# Patient Record
Sex: Male | Born: 1995 | Race: White | Hispanic: No | Marital: Single | Smoking: Current some day smoker
Health system: Southern US, Community
[De-identification: ages and names within clinical notes are randomized; demographics above are authoritative.]

## PROBLEM LIST (undated history)

## (undated) ENCOUNTER — Emergency Department (HOSPITAL_COMMUNITY): Discharge status: Absent without leave | Payer: 59

## (undated) DIAGNOSIS — R480 Dyslexia and alexia: Secondary | ICD-10-CM

## (undated) DIAGNOSIS — F909 Attention-deficit hyperactivity disorder, unspecified type: Secondary | ICD-10-CM

## (undated) HISTORY — PX: NO PAST SURGERIES: SHX2092

---
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
## 2014-12-07 ENCOUNTER — Encounter (HOSPITAL_COMMUNITY): Payer: Self-pay | Admitting: *Deleted

## 2014-12-07 ENCOUNTER — Emergency Department (HOSPITAL_COMMUNITY)
Admission: EM | Admit: 2014-12-07 | Discharge: 2014-12-07 | Disposition: A | Discharge status: Home / Self Care | Payer: 59 | Source: Home / Self Care | Attending: Family Medicine | Admitting: Family Medicine

## 2014-12-07 DIAGNOSIS — R040 Epistaxis: Secondary | ICD-10-CM | POA: Diagnosis not present

## 2014-12-07 MED ORDER — OXYMETAZOLINE HCL 0.05 % NA SOLN
NASAL | Status: AC
  Filled 2014-12-07: qty 15

## 2014-12-07 NOTE — Discharge Instructions (Signed)
Use bacitracin ointment twice a day for 2-3 days , use pressure and nose spray if bleeding, see ent doctor if further problems.

## 2014-12-07 NOTE — ED Provider Notes (Signed)
CSN: [id number]     Arrival date & time 12/07/14  1301 History   None    Chief Complaint  Patient presents with  . Epistaxis   (Consider location/radiation/quality/duration/timing/severity/associated sxs/prior Treatment) Patient is a 19 y.o. male presenting with nosebleeds. The history is provided by the patient.  Epistaxis Location:  L nare Severity:  Mild Timing:  Intermittent Progression:  Improving (sx of bleeding on fri s/s, none on mon or tues, recurred this am briefly, none at  present, h/o old nasal trauma.) Chronicity:  Recurrent Context: trauma   Relieved by:  Applying pressure Worsened by:  Nothing tried Associated symptoms: no blood in oropharynx, no congestion, no fever and no sneezing     History reviewed. No pertinent past medical history. History reviewed. No pertinent past surgical history. History reviewed. No pertinent family history. Social History  Substance Use Topics  . Smoking status: Never Smoker   . Smokeless tobacco: None  . Alcohol Use: No    Review of Systems  Constitutional: Negative.  Negative for fever.  HENT: Positive for nosebleeds. Negative for congestion and sneezing.     Allergies  Review of patient's allergies indicates not on file.  Home Medications   Prior to Admission medications   Not on File   BP 121/82 mmHg  Pulse 88  Temp(Src) 98.2 F (36.8 C) (Oral)  Resp 16  SpO2 98% Physical Exam  Constitutional: He is oriented to person, place, and time. He appears well-developed and well-nourished. No distress.  HENT:  Head: Normocephalic.  Right Ear: External ear normal.  Left Ear: External ear normal.  Nose: No sinus tenderness. No epistaxis.    Mouth/Throat: Oropharynx is clear and moist.  Eyes: Conjunctivae are normal. Pupils are equal, round, and reactive to light.  Neck: Normal range of motion. Neck supple.  Neurological: He is alert and oriented to person, place, and time.  Skin: Skin is warm and dry.  Nursing  note and vitals reviewed.   ED Course  Procedures (including critical care time) Labs Review Labs Reviewed - No data to display  Imaging Review No results found.   MDM   1. Left-sided epistaxis    Afrin and bacitracin applied, no bleeding at time of d/c.    Linna Hoff, MD 12/07/14 2019

## 2014-12-07 NOTE — ED Notes (Signed)
Pt reports   intermittant      Nosebleed     X  4  Days   Ago     reoccured  Today     -     Sitting  Upright on  Exam table  Speaking in  Complete  sentances  And  Is  In no  Acute   Distress

## 2016-04-07 ENCOUNTER — Emergency Department (HOSPITAL_COMMUNITY)
Admission: EM | Admit: 2016-04-07 | Discharge: 2016-04-08 | Disposition: A | Discharge status: Home / Self Care | Payer: Worker's Compensation | Attending: Emergency Medicine | Admitting: Emergency Medicine

## 2016-04-07 ENCOUNTER — Encounter (HOSPITAL_COMMUNITY): Payer: Self-pay | Admitting: *Deleted

## 2016-04-07 DIAGNOSIS — F17200 Nicotine dependence, unspecified, uncomplicated: Secondary | ICD-10-CM | POA: Diagnosis not present

## 2016-04-07 DIAGNOSIS — F909 Attention-deficit hyperactivity disorder, unspecified type: Secondary | ICD-10-CM | POA: Diagnosis not present

## 2016-04-07 DIAGNOSIS — Y929 Unspecified place or not applicable: Secondary | ICD-10-CM | POA: Diagnosis not present

## 2016-04-07 DIAGNOSIS — S299XXA Unspecified injury of thorax, initial encounter: Secondary | ICD-10-CM | POA: Diagnosis present

## 2016-04-07 DIAGNOSIS — S29012A Strain of muscle and tendon of back wall of thorax, initial encounter: Secondary | ICD-10-CM | POA: Insufficient documentation

## 2016-04-07 DIAGNOSIS — Y9389 Activity, other specified: Secondary | ICD-10-CM | POA: Diagnosis not present

## 2016-04-07 DIAGNOSIS — Y990 Civilian activity done for income or pay: Secondary | ICD-10-CM | POA: Diagnosis not present

## 2016-04-07 DIAGNOSIS — X501XXA Overexertion from prolonged static or awkward postures, initial encounter: Secondary | ICD-10-CM | POA: Insufficient documentation

## 2016-04-07 DIAGNOSIS — T148XXA Other injury of unspecified body region, initial encounter: Secondary | ICD-10-CM

## 2016-04-07 HISTORY — DX: Attention-deficit hyperactivity disorder, unspecified type: F90.9

## 2016-04-07 NOTE — ED Notes (Signed)
[facility] He states he picked up a trashcan lid and had a sudden sharp pain to his midback- He denies numbness tingling, incontinence and reports his pain has eased during the trip in by EMS- He moves all extremities fluidly and states his pain is more when he stands- He also states he feels like he should pop his back but cannot

## 2016-04-07 NOTE — ED Triage Notes (Signed)
Pt arrived by EMS complaining of back pain. Was at work bent over & felt pain in his back. Pt rating pain a 3/10 w/ pain in the mid back.

## 2016-04-08 ENCOUNTER — Emergency Department (HOSPITAL_COMMUNITY): Payer: Worker's Compensation

## 2016-04-08 MED ORDER — IBUPROFEN 100 MG/5ML PO SUSP
400.0000 mg | Freq: Once | ORAL | Status: AC
  Administered 2016-04-08: 400 mg via ORAL
  Filled 2016-04-08: qty 20

## 2016-04-08 NOTE — ED Provider Notes (Signed)
[facility]-EMERGENCY DEPT Provider Note   CSN: [id number] Arrival date & time: 04/07/16  2333     History   Chief Complaint Chief Complaint  Patient presents with  . Back Pain    HPI Maurice Smith is a 20 y.o. male.  The history is provided by the patient. No language interpreter was used.  Back Pain   This is a new problem. The problem occurs constantly. The problem has been gradually worsening. The pain is associated with no known injury. The pain is present in the thoracic spine. The quality of the pain is described as shooting and aching. The pain is moderate. The symptoms are aggravated by bending. The pain is the same all the time. Pertinent negatives include no chest pain. He has tried nothing for the symptoms.  Pt reports he bent over to pick up a trash can lid and had severe midback pain    Past Medical History:  Diagnosis Date  . ADHD     There are no active problems to display for this patient.   History reviewed. No pertinent surgical history.     Home Medications    Prior to Admission medications   Not on File    Family History No family history on file.  Social History Social History  Substance Use Topics  . Smoking status: Current Some Day Smoker  . Smokeless tobacco: Never Used  . Alcohol use No     Allergies   Keflex [cephalexin]   Review of Systems Review of Systems  Cardiovascular: Negative for chest pain.  Musculoskeletal: Positive for back pain.  All other systems reviewed and are negative.    Physical Exam Updated Vital Signs BP 124/87 (BP Location: Left Arm)   Pulse 80   Temp 97.5 F (36.4 C) (Oral)   Resp 18   Ht 5\' 6"  (1.676 m)   Wt 49 kg   SpO2 97%   BMI 17.43 kg/m   Physical Exam  Constitutional: He appears well-developed and well-nourished.  HENT:  Head: Normocephalic and atraumatic.  Right Ear: External ear normal.  Left Ear: External ear normal.  Eyes: Conjunctivae are normal.  Neck: Neck supple.    Cardiovascular: Normal rate and regular rhythm.   No murmur heard. Pulmonary/Chest: Effort normal and breath sounds normal. No respiratory distress.  Abdominal: Soft. There is no tenderness.  Musculoskeletal: He exhibits no edema.  Tender thoracic spine mid back,  Pain with movement   Neurological: He is alert.  Skin: Skin is warm and dry.  Psychiatric: He has a normal mood and affect.  Nursing note and vitals reviewed.    ED Treatments / Results  Labs (all labs ordered are listed, but only abnormal results are displayed) Labs Reviewed - No data to display  EKG  EKG Interpretation None       Radiology Dg Chest 2 View  Result Date: 04/08/2016 CLINICAL DATA:  20 year old male with chest and mid back pain. EXAM: CHEST  2 VIEW COMPARISON:  None. FINDINGS: The heart size and mediastinal contours are within normal limits. Both lungs are clear. The visualized skeletal structures are unremarkable. IMPRESSION: No active cardiopulmonary disease. Electronically Signed   By: Elgie CollardArash  Radparvar M.D.   On: 04/08/2016 01:25    Procedures Procedures (including critical care time)  Medications Ordered in ED Medications - No data to display   Initial Impression / Assessment and Plan / ED Course  I have reviewed the triage vital signs and the nursing notes.  Pertinent labs &  imaging results that were available during my care of the patient were reviewed by me and considered in my medical decision making (see chart for details).  Clinical Course     Pt advised ibuprofen.  He can not take pills.   Pt advised to take childrens ibuprofen  Final Clinical Impressions(s) / ED Diagnoses   Final diagnoses:  Muscle strain    New Prescriptions New Prescriptions   No medications on file  An After Visit Summary was printed and given to the patient.   Lonia SkinnerLeslie K GrahamSofia, PA-C 04/08/16 0131    Devoria AlbeIva Knapp, MD 04/08/16 [phone]

## 2016-04-08 NOTE — ED Notes (Signed)
Awaiting rad read and dispo 

## 2017-07-01 IMAGING — DX DG CHEST 2V
2 series · 2 of 2 positions shown · non-contrast
Comparison: None.

CLINICAL DATA: 20-year-old male with chest and mid back pain.

EXAM:
CHEST  2 VIEW

[chest pa]
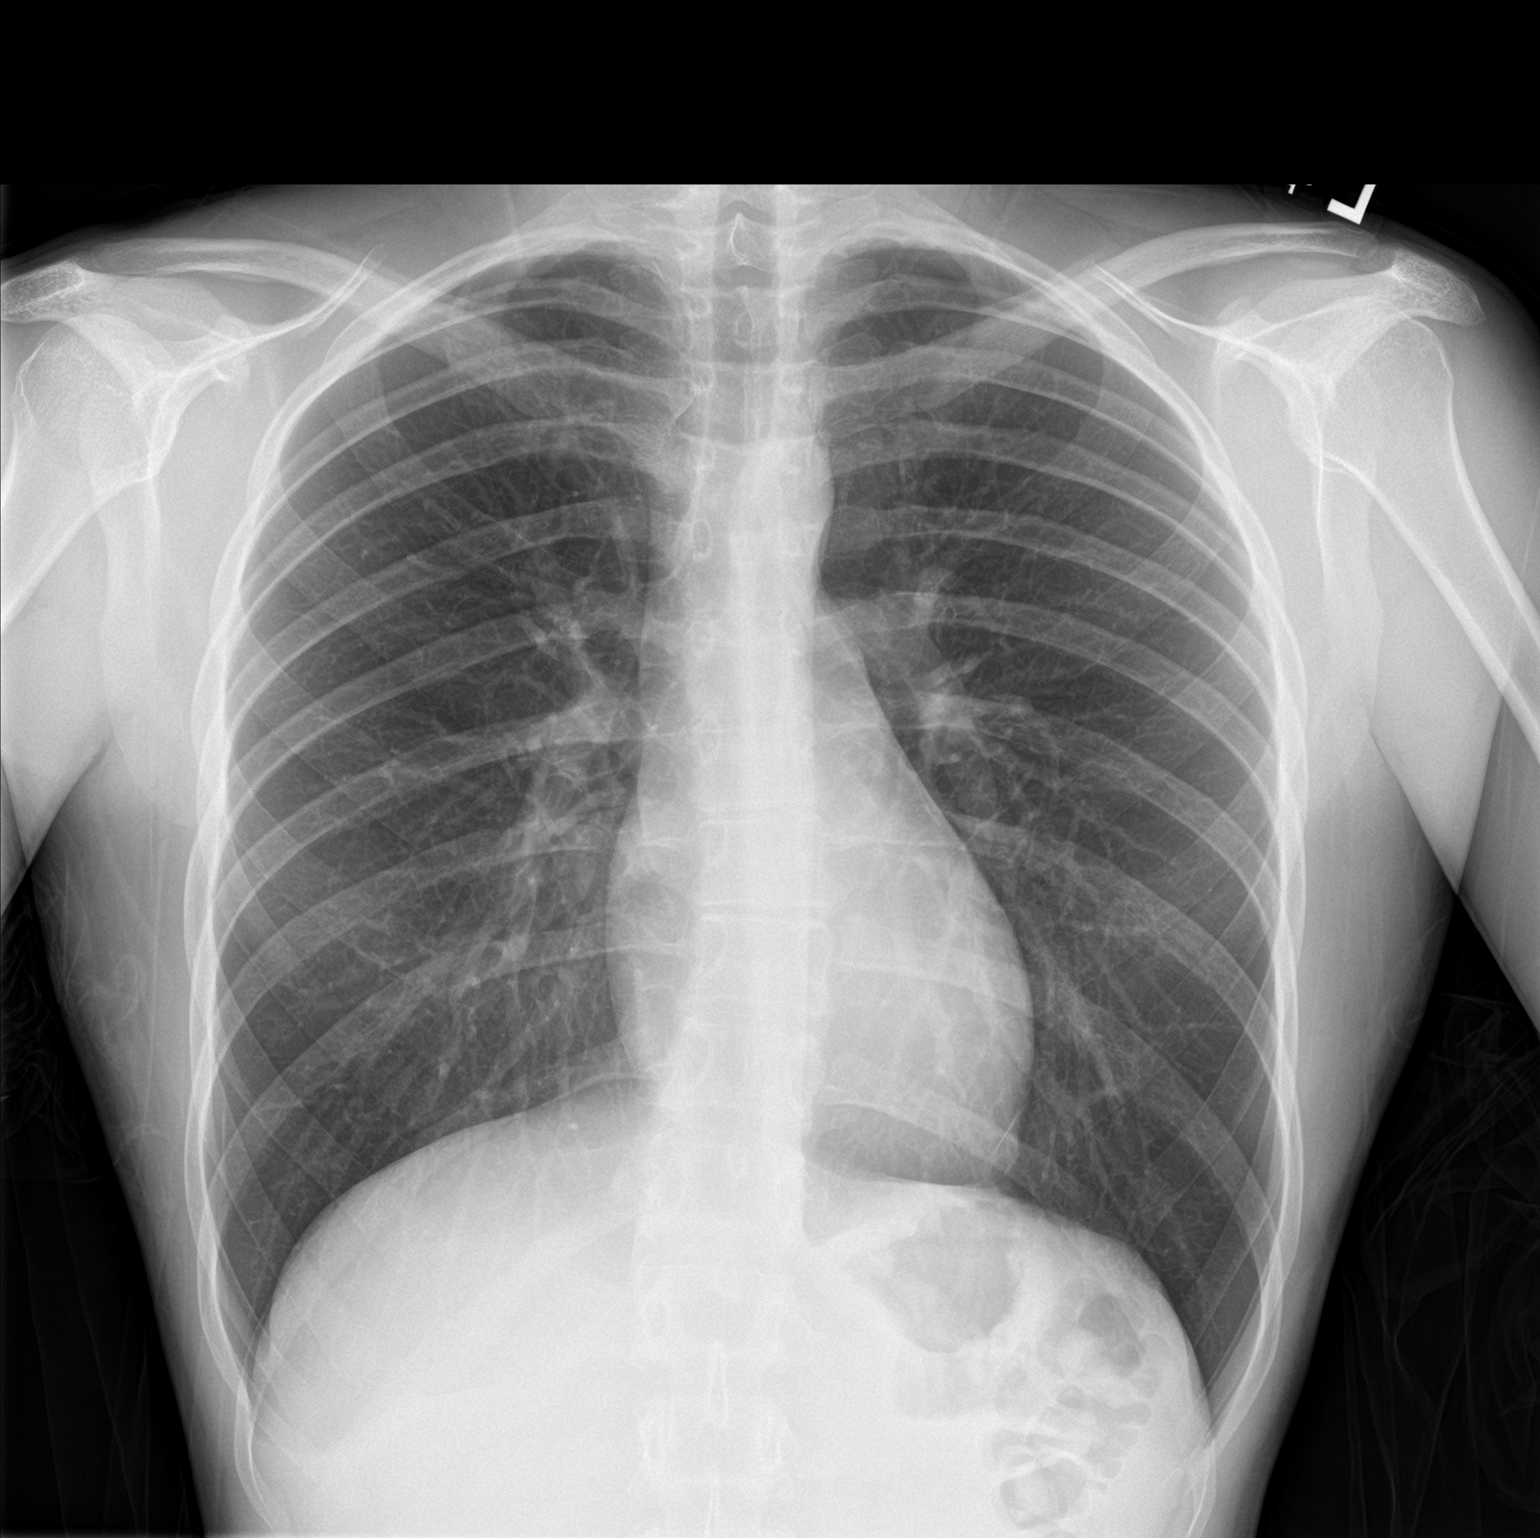

[chest lat]
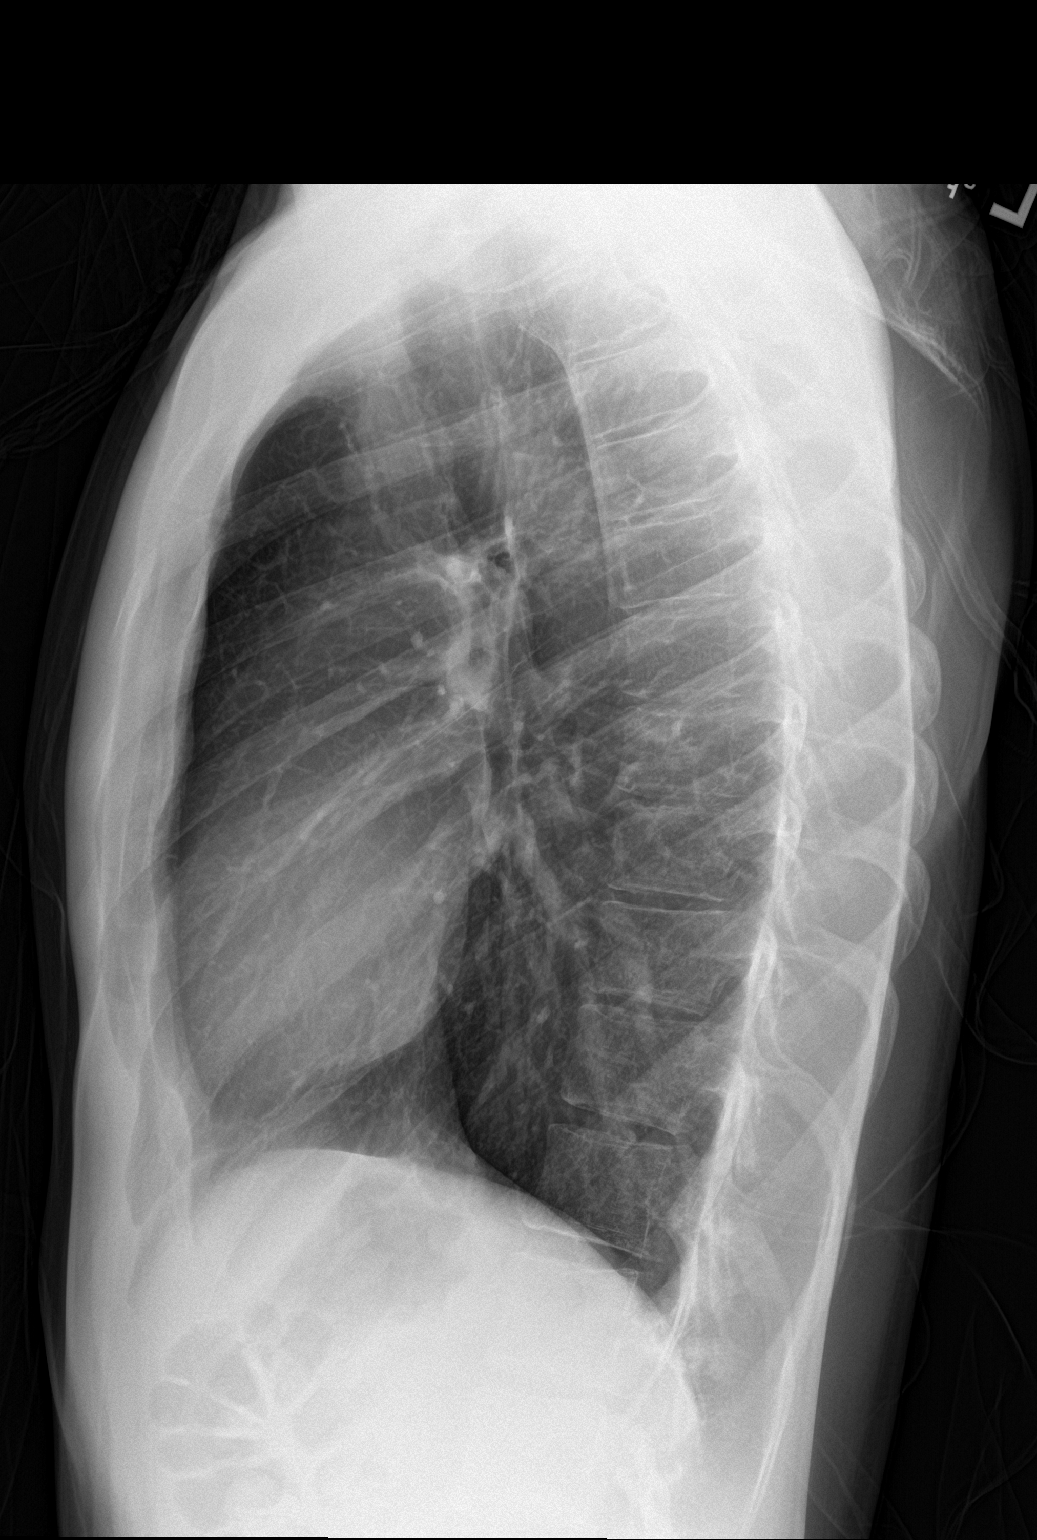

[2 of 2 positions shown; findings below may reference images not displayed]

FINDINGS: The heart size and mediastinal contours are within normal limits.
Both lungs are clear. The visualized skeletal structures are
unremarkable.
IMPRESSION: No active cardiopulmonary disease.

## 2017-07-26 ENCOUNTER — Encounter (HOSPITAL_COMMUNITY): Payer: Self-pay | Admitting: Emergency Medicine

## 2017-07-26 ENCOUNTER — Emergency Department (HOSPITAL_COMMUNITY)
Admission: EM | Admit: 2017-07-26 | Discharge: 2017-07-26 | Disposition: A | Discharge status: Home / Self Care | Payer: Commercial Managed Care - PPO | Attending: Emergency Medicine | Admitting: Emergency Medicine

## 2017-07-26 DIAGNOSIS — R1084 Generalized abdominal pain: Secondary | ICD-10-CM | POA: Insufficient documentation

## 2017-07-26 DIAGNOSIS — R197 Diarrhea, unspecified: Secondary | ICD-10-CM | POA: Insufficient documentation

## 2017-07-26 DIAGNOSIS — F909 Attention-deficit hyperactivity disorder, unspecified type: Secondary | ICD-10-CM | POA: Insufficient documentation

## 2017-07-26 DIAGNOSIS — F17210 Nicotine dependence, cigarettes, uncomplicated: Secondary | ICD-10-CM | POA: Insufficient documentation

## 2017-07-26 DIAGNOSIS — R112 Nausea with vomiting, unspecified: Secondary | ICD-10-CM

## 2017-07-26 LAB — CBC
HCT: 45.6 % (ref 39.0–52.0)
HEMOGLOBIN: 15.4 g/dL (ref 13.0–17.0)
MCH: 28.8 pg (ref 26.0–34.0)
MCHC: 33.8 g/dL (ref 30.0–36.0)
MCV: 85.4 fL (ref 78.0–100.0)
Platelets: 237 10*3/uL (ref 150–400)
RBC: 5.34 MIL/uL (ref 4.22–5.81)
RDW: 12.8 % (ref 11.5–15.5)
WBC: 12.1 10*3/uL — ABNORMAL HIGH (ref 4.0–10.5)

## 2017-07-26 LAB — COMPREHENSIVE METABOLIC PANEL
ALBUMIN: 4.9 g/dL (ref 3.5–5.0)
ALT: 44 U/L (ref 17–63)
ANION GAP: 14 (ref 5–15)
AST: 33 U/L (ref 15–41)
Alkaline Phosphatase: 98 U/L (ref 38–126)
BUN: 13 mg/dL (ref 6–20)
CHLORIDE: 104 mmol/L (ref 101–111)
CO2: 22 mmol/L (ref 22–32)
Calcium: 9.7 mg/dL (ref 8.9–10.3)
Creatinine, Ser: 0.83 mg/dL (ref 0.61–1.24)
GFR calc non Af Amer: >60 mL/min (ref >60)
GLUCOSE: 135 mg/dL — AB (ref 65–99)
Potassium: 3.8 mmol/L (ref 3.5–5.1)
SODIUM: 140 mmol/L (ref 135–145)
Total Bilirubin: 1 mg/dL (ref 0.3–1.2)
Total Protein: 8.2 g/dL — ABNORMAL HIGH (ref 6.5–8.1)

## 2017-07-26 LAB — URINALYSIS, ROUTINE W REFLEX MICROSCOPIC
BILIRUBIN URINE: NEGATIVE
GLUCOSE, UA: NEGATIVE mg/dL
Hgb urine dipstick: NEGATIVE
Ketones, ur: 5 mg/dL — AB
Leukocytes, UA: NEGATIVE
Nitrite: NEGATIVE
PH: 7 (ref 5.0–8.0)
Protein, ur: NEGATIVE mg/dL
SPECIFIC GRAVITY, URINE: 1.028 (ref 1.005–1.030)

## 2017-07-26 LAB — LIPASE, BLOOD: LIPASE: 32 U/L (ref 11–51)

## 2017-07-26 MED ORDER — ONDANSETRON HCL 4 MG/2ML IJ SOLN
INTRAMUSCULAR | Status: AC
  Filled 2017-07-26: qty 2

## 2017-07-26 MED ORDER — ONDANSETRON HCL 4 MG/2ML IJ SOLN
4.0000 mg | Freq: Once | INTRAMUSCULAR | Status: AC
  Administered 2017-07-26: 4 mg via INTRAVENOUS
  Filled 2017-07-26: qty 2

## 2017-07-26 MED ORDER — SODIUM CHLORIDE 0.9 % IV BOLUS
1000.0000 mL | Freq: Once | INTRAVENOUS | Status: AC
  Administered 2017-07-26: 1000 mL via INTRAVENOUS

## 2017-07-26 MED ORDER — ONDANSETRON HCL 4 MG/2ML IJ SOLN
4.0000 mg | Freq: Once | INTRAMUSCULAR | Status: AC
  Administered 2017-07-26: 4 mg via INTRAVENOUS

## 2017-07-26 MED ORDER — ONDANSETRON HCL 8 MG PO TABS: 8.0000 mg | ORAL_TABLET | Freq: Three times a day (TID) | ORAL | 0 refills | Status: DC | PRN

## 2017-07-26 MED ORDER — KETOROLAC TROMETHAMINE 30 MG/ML IJ SOLN
30.0000 mg | Freq: Once | INTRAMUSCULAR | Status: AC
  Administered 2017-07-26: 30 mg via INTRAVENOUS
  Filled 2017-07-26: qty 1

## 2017-07-26 MED ORDER — ONDANSETRON 4 MG PO TBDP
4.0000 mg | ORAL_TABLET | Freq: Once | ORAL | Status: AC | PRN
  Administered 2017-07-26: 4 mg via ORAL
  Filled 2017-07-26: qty 1

## 2017-07-26 MED ORDER — ONDANSETRON 8 MG PO TBDP: 8.0000 mg | ORAL_TABLET | Freq: Three times a day (TID) | ORAL | 0 refills | Status: DC | PRN

## 2017-07-26 NOTE — ED Notes (Signed)
Pt given ginger ale around 30 minutes ago. Pt is tolerating P/O challenge and has no further complaints of nausea or vomiting

## 2017-07-26 NOTE — Discharge Instructions (Addendum)
Start with a clear liquid diet today, using 1-2 ounces every hour then gradually advance to a regular diet.  For fever or pain, use Tylenol.  Follow-up with a doctor if you are not better in 4-5 days.  Return here, if needed, for problems.

## 2017-07-26 NOTE — ED Provider Notes (Signed)
Medical decision making [facility] EMERGENCY DEPARTMENT Provider Note   CSN: [id number] Arrival date & time: 07/26/17  0705     History   Chief Complaint Chief Complaint  Patient presents with  . Abdominal Pain    HPI Maurice Smith is a 22 y.o. male.  Patient presents for evaluation of nausea, vomiting and diarrhea.  Symptoms started this morning.  He complains of upper abdominal pain.  No known sick contacts or abnormal food ingestions.  He works as a Nature conservation officer at night.  He denies other recent problems.  There has been no fever, chills, headache, cough, chest pain, weakness or dizziness.  There are no other known modifying factors.  HPI  Past Medical History:  Diagnosis Date  . ADHD     There are no active problems to display for this patient.   History reviewed. No pertinent surgical history.      Home Medications    Prior to Admission medications   Medication Sig Start Date End Date Taking? Authorizing Provider  ondansetron (ZOFRAN-ODT) 8 MG disintegrating tablet Take 1 tablet (8 mg total) by mouth every 8 (eight) hours as needed for nausea or vomiting. 07/26/17   Mancel Bale, MD    Family History History reviewed. No pertinent family history.  Social History Social History   Tobacco Use  . Smoking status: Current Some Day Smoker  . Smokeless tobacco: Never Used  Substance Use Topics  . Alcohol use: Yes    Comment: occasionally  . Drug use: Never     Allergies   Keflex [cephalexin]   Review of Systems Review of Systems  All other systems reviewed and are negative.    Physical Exam Updated Vital Signs BP 118/66   Pulse (!) 101   Temp 97.7 F (36.5 C) (Oral)   Resp 16   Ht 5\' 6"  (1.676 m)   Wt 54.4 kg (120 lb)   SpO2 98%   BMI 19.37 kg/m   Physical Exam  Constitutional: He is oriented to person, place, and time. He appears well-developed and well-nourished. He does not appear ill.  HENT:  Head: Normocephalic and atraumatic.  Right  Ear: External ear normal.  Left Ear: External ear normal.  Eyes: Pupils are equal, round, and reactive to light. Conjunctivae and EOM are normal.  Neck: Normal range of motion and phonation normal. Neck supple.  Cardiovascular: Normal rate, regular rhythm and normal heart sounds.  Pulmonary/Chest: Effort normal and breath sounds normal. He exhibits no bony tenderness.  Abdominal: Soft. He exhibits no distension and no ascites. Bowel sounds are decreased. There is tenderness (Mild) in the right upper quadrant and left upper quadrant. There is no rigidity and no guarding. Hernia confirmed negative in the ventral area.  Musculoskeletal: Normal range of motion.  Neurological: He is alert and oriented to person, place, and time. No cranial nerve deficit or sensory deficit. He exhibits normal muscle tone. Coordination normal.  Skin: Skin is warm, dry and intact.  Psychiatric: He has a normal mood and affect. His behavior is normal. Judgment and thought content normal.  Nursing note and vitals reviewed.    ED Treatments / Results  Labs (all labs ordered are listed, but only abnormal results are displayed) Labs Reviewed  COMPREHENSIVE METABOLIC PANEL - Abnormal; Notable for the following components:      Result Value   Glucose, Bld 135 (*)    Total Protein 8.2 (*)    All other components within normal limits  CBC - Abnormal; Notable  for the following components:   WBC 12.1 (*)    All other components within normal limits  URINALYSIS, ROUTINE W REFLEX MICROSCOPIC - Abnormal; Notable for the following components:   APPearance HAZY (*)    Ketones, ur 5 (*)    All other components within normal limits  LIPASE, BLOOD    EKG None  Radiology No results found.  Procedures Procedures (including critical care time)  Medications Ordered in ED Medications  ondansetron (ZOFRAN-ODT) disintegrating tablet 4 mg (4 mg Oral Given 07/26/17 0752)  sodium chloride 0.9 % bolus 1,000 mL (0 mLs  Intravenous Stopped 07/26/17 1140)  ondansetron (ZOFRAN) injection 4 mg (4 mg Intravenous Given 07/26/17 0859)  ketorolac (TORADOL) 30 MG/ML injection 30 mg (30 mg Intravenous Given 07/26/17 0859)  ondansetron (ZOFRAN) injection 4 mg (4 mg Intravenous Given 07/26/17 1228)     Initial Impression / Assessment and Plan / ED Course  I have reviewed the triage vital signs and the nursing notes.  Pertinent labs & imaging results that were available during my care of the patient were reviewed by me and considered in my medical decision making (see chart for details).      Patient Vitals for the past 24 hrs:  BP Temp Temp src Pulse Resp SpO2 Height Weight  07/26/17 1237 118/66 - - (!) 101 16 98 % - -  07/26/17 1100 111/63 - - 98 18 100 % - -  07/26/17 0714 126/77 97.7 F (36.5 C) Oral 96 16 100 % - -  07/26/17 0713 - - - - - - 5\' 6"  (1.676 m) 54.4 kg (120 lb)    11:56 AM Reevaluation with update and discussion. After initial assessment and treatment, an updated evaluation reveals he was able to tolerate some oral fluid but then vomited after about an hour.  At this time he feels fairly well.  Discussed findings and plan with patient and his father, all questions were answered. Mancel BaleElliott Audianna Landgren   Medical decision making-nonspecific vomiting and diarrhea.  Abdominal pain likely secondary to vomiting.  Doubt bacterial colitis, metabolic instability or impending vascular collapse.  No indication for treatment with antibiotics for diarrhea at this time.  Differential diagnosis includes food poisoning, gastroenteritis from viral source, and nonspecific enteritis.  Nursing Notes Reviewed/ Care Coordinated Applicable Imaging Reviewed Interpretation of Laboratory Data incorporated into ED treatment  The patient appears reasonably screened and/or stabilized for discharge and I doubt any other medical condition or other [facility] requiring further screening, evaluation, or treatment in the ED at this time prior to  discharge.  Plan: Home Medications-OTC analgesia as needed; Home Treatments-gradually advance diet PCP as needed; return here if the recommended treatment, does not improve the symptoms; Recommended follow up- PCP as needed   Final Clinical Impressions(s) / ED Diagnoses   Final diagnoses:  Nausea vomiting and diarrhea    ED Discharge Orders        Ordered    ondansetron (ZOFRAN) 8 MG tablet  Every 8 hours PRN,   Status:  Discontinued     07/26/17 1159    ondansetron (ZOFRAN-ODT) 8 MG disintegrating tablet  Every 8 hours PRN     07/26/17 1233       Mancel BaleWentz, Teja Costen, MD 07/26/17 [phone]

## 2017-07-26 NOTE — ED Triage Notes (Signed)
Patient complaining of abdominal pain with vomiting and diarrhea that started at 0200 this morning.

## 2017-07-26 NOTE — ED Notes (Signed)
Pt informed of need for urine sample. Pt states he will provide a sample as soon as he is able to do so.

## 2017-07-26 NOTE — ED Notes (Signed)
Pt drank a cup of gingerale without vomiting.  Resting with eyes shut at this time.

## 2019-04-27 ENCOUNTER — Other Ambulatory Visit: Payer: Self-pay

## 2019-04-27 ENCOUNTER — Ambulatory Visit: Payer: Commercial Managed Care - PPO | Attending: Internal Medicine

## 2019-04-27 DIAGNOSIS — Z20822 Contact with and (suspected) exposure to covid-19: Secondary | ICD-10-CM

## 2019-04-28 LAB — NOVEL CORONAVIRUS, NAA: SARS-CoV-2, NAA: NOT DETECTED

## 2022-12-11 ENCOUNTER — Ambulatory Visit (HOSPITAL_COMMUNITY)
Admission: EM | Admit: 2022-12-11 | Discharge: 2022-12-12 | Disposition: A | Discharge status: Other Acute Inpatient Hospital | Payer: BC Managed Care – PPO | Attending: Registered Nurse | Admitting: Registered Nurse

## 2022-12-11 ENCOUNTER — Encounter (HOSPITAL_COMMUNITY): Payer: Self-pay | Admitting: Registered Nurse

## 2022-12-11 ENCOUNTER — Other Ambulatory Visit: Payer: Self-pay

## 2022-12-11 DIAGNOSIS — Z658 Other specified problems related to psychosocial circumstances: Secondary | ICD-10-CM | POA: Diagnosis not present

## 2022-12-11 DIAGNOSIS — Z9152 Personal history of nonsuicidal self-harm: Secondary | ICD-10-CM | POA: Insufficient documentation

## 2022-12-11 DIAGNOSIS — F322 Major depressive disorder, single episode, severe without psychotic features: Secondary | ICD-10-CM | POA: Diagnosis not present

## 2022-12-11 DIAGNOSIS — R45851 Suicidal ideations: Secondary | ICD-10-CM

## 2022-12-11 LAB — CBC WITH DIFFERENTIAL/PLATELET
Abs Immature Granulocytes: 0.03 10*3/uL (ref 0.00–0.07)
Basophils Absolute: 0 10*3/uL (ref 0.0–0.1)
Basophils Relative: 0 %
Eosinophils Absolute: 0 10*3/uL (ref 0.0–0.5)
Eosinophils Relative: 0 %
HCT: 43.8 % (ref 39.0–52.0)
Hemoglobin: 15.6 g/dL (ref 13.0–17.0)
Immature Granulocytes: 0 %
Lymphocytes Relative: 15 %
Lymphs Abs: 1.5 10*3/uL (ref 0.7–4.0)
MCH: 29.9 pg (ref 26.0–34.0)
MCHC: 35.6 g/dL (ref 30.0–36.0)
MCV: 84.1 fL (ref 80.0–100.0)
Monocytes Absolute: 0.3 10*3/uL (ref 0.1–1.0)
Monocytes Relative: 4 %
Neutro Abs: 8 10*3/uL — ABNORMAL HIGH (ref 1.7–7.7)
Neutrophils Relative %: 81 %
Platelets: 319 10*3/uL (ref 150–400)
RBC: 5.21 MIL/uL (ref 4.22–5.81)
RDW: 12.9 % (ref 11.5–15.5)
WBC: 9.8 10*3/uL (ref 4.0–10.5)
nRBC: 0 % (ref 0.0–0.2)

## 2022-12-11 LAB — URINALYSIS, ROUTINE W REFLEX MICROSCOPIC
Bacteria, UA: NONE SEEN
Bilirubin Urine: NEGATIVE
Glucose, UA: NEGATIVE mg/dL
Hgb urine dipstick: NEGATIVE
Ketones, ur: 20 mg/dL — AB
Leukocytes,Ua: NEGATIVE
Nitrite: NEGATIVE
Protein, ur: NEGATIVE mg/dL
Specific Gravity, Urine: 1.017 (ref 1.005–1.030)
pH: 7 (ref 5.0–8.0)

## 2022-12-11 LAB — COMPREHENSIVE METABOLIC PANEL
ALT: 13 U/L (ref 0–44)
AST: 18 U/L (ref 15–41)
Albumin: 5 g/dL (ref 3.5–5.0)
Alkaline Phosphatase: 67 U/L (ref 38–126)
Anion gap: 10 (ref 5–15)
BUN: 7 mg/dL (ref 6–20)
CO2: 25 mmol/L (ref 22–32)
Calcium: 9.5 mg/dL (ref 8.9–10.3)
Chloride: 102 mmol/L (ref 98–111)
Creatinine, Ser: 0.99 mg/dL (ref 0.61–1.24)
GFR, Estimated: >60 mL/min (ref >60)
Glucose, Bld: 134 mg/dL — ABNORMAL HIGH (ref 70–99)
Potassium: 4.1 mmol/L (ref 3.5–5.1)
Sodium: 137 mmol/L (ref 135–145)
Total Bilirubin: 1 mg/dL (ref 0.3–1.2)
Total Protein: 8 g/dL (ref 6.5–8.1)

## 2022-12-11 LAB — ETHANOL: Alcohol, Ethyl (B): <10 mg/dL (ref <10)

## 2022-12-11 LAB — POCT URINE DRUG SCREEN - MANUAL ENTRY (I-SCREEN)
POC Amphetamine UR: NOT DETECTED
POC Buprenorphine (BUP): NOT DETECTED
POC Cocaine UR: NOT DETECTED
POC Marijuana UR: NOT DETECTED
POC Methadone UR: NOT DETECTED
POC Methamphetamine UR: NOT DETECTED
POC Morphine: NOT DETECTED
POC Oxazepam (BZO): NOT DETECTED
POC Oxycodone UR: NOT DETECTED
POC Secobarbital (BAR): NOT DETECTED

## 2022-12-11 LAB — TSH: TSH: 1.353 u[IU]/mL (ref 0.350–4.500)

## 2022-12-11 LAB — HEMOGLOBIN A1C
Hgb A1c MFr Bld: 5 % (ref 4.8–5.6)
Mean Plasma Glucose: 96.8 mg/dL

## 2022-12-11 LAB — LIPID PANEL
Cholesterol: 113 mg/dL (ref 0–200)
HDL: 49 mg/dL (ref >40)
LDL Cholesterol: 56 mg/dL (ref 0–99)
Total CHOL/HDL Ratio: 2.3 RATIO
Triglycerides: 39 mg/dL (ref <150)
VLDL: 8 mg/dL (ref 0–40)

## 2022-12-11 LAB — MAGNESIUM: Magnesium: 2.2 mg/dL (ref 1.7–2.4)

## 2022-12-11 MED ORDER — TRAZODONE HCL 50 MG PO TABS
50.0000 mg | ORAL_TABLET | Freq: Every evening | ORAL | Status: DC | PRN
  Filled 2022-12-11: qty 1

## 2022-12-11 MED ORDER — ALUM & MAG HYDROXIDE-SIMETH 200-200-20 MG/5ML PO SUSP: 30.0000 mL | ORAL | Status: DC | PRN

## 2022-12-11 MED ORDER — MAGNESIUM HYDROXIDE 400 MG/5ML PO SUSP: 30.0000 mL | Freq: Every day | ORAL | Status: DC | PRN

## 2022-12-11 MED ORDER — ACETAMINOPHEN 325 MG PO TABS: 650.0000 mg | ORAL_TABLET | Freq: Four times a day (QID) | ORAL | Status: DC | PRN

## 2022-12-11 MED ORDER — HYDROXYZINE HCL 25 MG PO TABS
25.0000 mg | ORAL_TABLET | Freq: Three times a day (TID) | ORAL | Status: DC | PRN
  Filled 2022-12-11: qty 1

## 2022-12-11 NOTE — ED Notes (Signed)
Pt sleeping@this time. Breathing even and unlabored. Will continue to monitor for safety 

## 2022-12-11 NOTE — ED Provider Notes (Cosign Needed Addendum)
[facility] Continuous Assessment Admission H&P  Date: 12/11/22 Patient Name: Maurice Smith MRN: [id number] Chief Complaint: depression, passive suicidal ideation  Diagnoses:  Final diagnoses:  MDD (major depressive disorder), single episode, severe , no psychosis (HCC)  Passive suicidal ideations    HPI: History of Present illness: Maurice Smith is a 27 y.o. male patient presented to [facility] as a walk in accompanied by his mother [name] )with complaints of worsening depression and passive suicidal ideation   Maurice Smith, 27 y.o., male patient seen face to face by this provider, chart reviewed, and consulted with Dr. Nelly Rout on 12/11/22.  On evaluation Maurice Smith reports he came in today because "I just get to the point to where I don't want to commit suicide because I know how it is on everybody but if I get in an accident I wouldn't care if I die. I couldn't do it to myself because I couldn't do that to my mom." Patient reports that they do have a history of self-harming behavior (cutting) but he hasn't cut since 2021 appear reported some behavior he started while he was married I used it as a means to punish himself. Patient reports he has been divorced for two years now. He states his wife get pregnant by another guy which led to the divorce. Patient stating periods of depression since did the divorce.  Patient states he recently broke up with girlfriend after she cheated.  States they work together, and she came to work with hickeys all over neck.  A guy "told me look at that (referring to the hickeys on her neck) come to find out he was the one that put the hickeys there."  Patient states he feels worthless "That nobody cares about me, that I can't be happy, and that I'm a disappointment."  Patient states he has been having suicidal thoughts of want to be dead but no specific plan, but he is unable to contract for safety.  He denies homicidal ideation, psychosis, and paranoia.   Patient does state that he watched his ex father in law try to kill himself twice by stabbing himself in the neck.  Denies prior psychiatric hospitalization, psychotropic medications, or outpatient psychiatric services.  Patient is currently living with his mother who is also supportive.  Patient is employed "All I do is work and sleep."     During evaluation Lemichael Smith is seated in exam room with mother at his side.  Patient gave permission for mother to sit in on assessment.  Patient is dressed appropriately for weather with no noted distress.  He is alert/oriented x 4, calm, cooperative, attentive, and responses were relevant and appropriate to assessment questions.  He spoke in a clear tone at moderate volume, and normal pace, with good eye contact.   He denies homicidal ideation, psychosis, and paranoia but continues to endorse suicidal ideation with no specific plan but unable to contract for safety.  Objectively there is no evidence of psychosis/mania or delusional thinking.  He conversed coherently, with goal directed thoughts, no distractibility, or pre-occupation.  Recommended for inpatient psychiatric treatment.     Total Time spent with patient: 45 minutes  Musculoskeletal  Strength & Muscle Tone: within normal limits Gait & Station: normal Patient leans: N/A  Psychiatric Specialty Exam  Presentation General Appearance:  Appropriate for Environment  Eye Contact: Minimal  Speech: Clear and Coherent; Normal Rate  Speech Volume: Normal  Handedness: Right   Mood and Affect  Mood: Depressed  Affect: Depressed; Flat   Thought Process  Thought Processes: Coherent; Goal Directed  Descriptions of Associations:Intact  Orientation:Full (Time, Place and Person)  Thought Content:Logical    Hallucinations:Hallucinations: None  Ideas of Reference:None  Suicidal Thoughts:Suicidal Thoughts: Yes, Passive SI Passive Intent and/or Plan: Without Plan (unable to contract  for safety)  Homicidal Thoughts:Homicidal Thoughts: No   Sensorium  Memory: Immediate Good; Recent Good; Remote Good  Judgment: Intact  Insight: Fair; Present   Executive Functions  Concentration: Good  Attention Span: Good  Recall: Good  Fund of Knowledge: Good  Language: Good   Psychomotor Activity  Psychomotor Activity: Psychomotor Activity: Normal   Assets  Assets: Communication Skills; Desire for Improvement; Financial Resources/Insurance; Housing; Leisure Time; Physical Health; Resilience; Social Support; Transportation   Sleep  Sleep: Sleep: Good   Nutritional Assessment (For OBS and FBC admissions only) Has the patient had a weight loss or gain of 10 pounds or more in the last 3 months?: No Has the patient had a decrease in food intake/or appetite?: No Does the patient have dental problems?: No Does the patient have eating habits or behaviors that may be indicators of an eating disorder including binging or inducing vomiting?: No Has the patient recently lost weight without trying?: 0 Has the patient been eating poorly because of a decreased appetite?: 0 Malnutrition Screening Tool Score: 0    Physical Exam Vitals and nursing note reviewed. Chaperone present: mother accompanied.  Constitutional:      General: He is not in acute distress.    Appearance: Normal appearance. He is not ill-appearing.  HENT:     Head: Normocephalic.  Eyes:     Conjunctiva/sclera: Conjunctivae normal.  Cardiovascular:     Rate and Rhythm: Normal rate.  Pulmonary:     Effort: Pulmonary effort is normal.  Musculoskeletal:        General: Normal range of motion.     Cervical back: Normal range of motion.  Skin:    General: Skin is warm and dry.  Neurological:     Mental Status: He is alert and oriented to person, place, and time.  Psychiatric:        Attention and Perception: Attention and perception normal. He does not perceive auditory or visual  hallucinations.        Mood and Affect: Mood is depressed. Affect is flat.        Speech: Speech normal.        Behavior: Behavior normal. Behavior is cooperative.        Thought Content: Thought content is not paranoid or delusional. Thought content does not include homicidal ideation. Suicidal: Passive thoughts unable to contract for safety.       Cognition and Memory: Cognition and memory normal.        Judgment: Judgment is impulsive.    Review of Systems  Constitutional:        No other complaints voiced  Psychiatric/Behavioral:  Positive for depression. Hallucinations: Denies. Substance abuse: denies. Suicidal ideas: Passive thoughts.  States that he wouldn't actively kill himself but doesn't care if he dies..The patient is nervous/anxious. The patient does not have insomnia.   All other systems reviewed and are negative.   Blood pressure 110/77, pulse 89, temperature 97.6 F (36.4 C), temperature source Oral, resp. rate 17, SpO2 98%. There is no height or weight on file to calculate BMI.  Past Psychiatric History: Denies prior psychiatric history other than ADHD   Is the patient at risk to self? Yes  Has the patient been a risk to self in the past 6 months? No .    Has the patient been a risk to self within the distant past? No   Is the patient a risk to others? No   Has the patient been a risk to others in the past 6 months? No   Has the patient been a risk to others within the distant past? No   Past Medical History: No medical issues reported Past Medical History:  Diagnosis Date   ADHD      Family History: History reviewed. No pertinent family history.   None reported  Social History: Lives with mother employed Social History   Tobacco Use   Smoking status: Some Days   Smokeless tobacco: Never  Substance Use Topics   Alcohol use: Yes    Comment: occasionally   Drug use: Never     Last Labs:  No visits with results within 6 Month(s) from this visit.  Latest  known visit with results is:  Lab on 04/27/2019  Component Date Value Ref Range Status   SARS-CoV-2, NAA 04/27/2019 Not Detected  Not Detected Final   Comment: This nucleic acid amplification test was developed and its performance characteristics determined by [employer]. Nucleic acid amplification tests include PCR and TMA. This test has not been FDA cleared or approved. This test has been authorized by FDA under an Emergency Use Authorization (EUA). This test is only authorized for the duration of time the declaration that circumstances exist justifying the authorization of the emergency use of in vitro diagnostic tests for detection of SARS-CoV-2 virus and/or diagnosis of COVID-19 infection under section 564(b)(1) of the Act, 21 U.S.C. 161WRU-0(A) (1), unless the authorization is terminated or revoked sooner. When diagnostic testing is negative, the possibility of a false negative result should be considered in the context of a patient's recent exposures and the presence of clinical signs and symptoms consistent with COVID-19. An individual without symptoms of COVID-19 and who is not shedding SARS-CoV-2 virus would                           expect to have a negative (not detected) result in this assay.     Allergies: Keflex [cephalexin]  Medications:  Facility Ordered Medications  Medication   acetaminophen (TYLENOL) tablet 650 mg   alum & mag hydroxide-simeth (MAALOX/MYLANTA) 200-200-20 MG/5ML suspension 30 mL   magnesium hydroxide (MILK OF MAGNESIA) suspension 30 mL   hydrOXYzine (ATARAX) tablet 25 mg   traZODone (DESYREL) tablet 50 mg   PTA Medications  Medication Sig   ondansetron (ZOFRAN-ODT) 8 MG disintegrating tablet Take 1 tablet (8 mg total) by mouth every 8 (eight) hours as needed for nausea or vomiting.   Medical Decision Making  Liam Arbon was admitted to [facility] continuous observation unit for MDD (major depressive  disorder), single episode, severe , no psychosis (HCC), crisis management, and stabilization while awaiting appropriate bed for inpatient psychiatric treatment Routine labs ordered, which include Lab Orders         CBC with Differential/Platelet         Comprehensive metabolic panel         Hemoglobin A1c         Magnesium         Ethanol         Lipid panel         TSH  Prolactin         Urinalysis, Routine w reflex microscopic -Urine, Clean Catch         POCT Urine Drug Screen - (I-Screen)    Medication Management: Medications started Meds ordered this encounter  Medications   acetaminophen (TYLENOL) tablet 650 mg   alum & mag hydroxide-simeth (MAALOX/MYLANTA) 200-200-20 MG/5ML suspension 30 mL   magnesium hydroxide (MILK OF MAGNESIA) suspension 30 mL   hydrOXYzine (ATARAX) tablet 25 mg   traZODone (DESYREL) tablet 50 mg   Will maintain continuous observation for safety. Social work will consult patient regarding psychiatric hospitalization and to discuss discharge and follow up plan.  Recommendations  Based on my evaluation the patient does not appear to have an emergency medical condition.  Inpatient psychiatric treatment  Assunta Found, NP 12/11/22  6:36 PM

## 2022-12-11 NOTE — ED Notes (Signed)
Pt A&O x 4, presents with depression and passive suicidal thoughts.  Family stressors noted, Pt calm & cooperative, no distress noted.  Monitoring for safety.

## 2022-12-11 NOTE — ED Provider Notes (Cosign Needed Addendum)
Behavioral Health Urgent Care Medical Screening Exam  Patient Name: Jessiel Langlais MRN: [id number] Date of Evaluation: 12/11/22 Chief Complaint:   Diagnosis:  Final diagnoses:  MDD (major depressive disorder), single episode, severe , no psychosis (HCC)  Passive suicidal ideations    History of Present illness: Stephen Ristine is a 27 y.o. male patient presented to [facility] as a walk in accompanied by his mother [name] )with complaints of worsening depression and passive suicidal ideation  Flossie Amato, 27 y.o., male patient seen face to face by this provider, chart reviewed, and consulted with Dr. Nelly Rout on 12/11/22.  On evaluation Shonta Meli reports he came in today because "I just get to the point to where I don't want to commit suicide because I know how it is on everybody but if I get in an accident I wouldn't care if I die. I couldn't do it to myself because I couldn't do that to my mom." Patient reports that they do have a history of self-harming behavior (cutting) but he hasn't cut since 2021 appear reported some behavior he started while he was married I used it as a means to punish himself. Patient reports he has been divorced for two years now. He states his wife get pregnant by another guy which led to the divorce. Patient stating periods of depression since did the divorce.  Patient states he recently broke up with girlfriend after she cheated.  States they work together, and she came to work with hickeys all over neck.  A guy "told me look at that (referring to the hickeys on her neck) come to find out he was the one that put the hickeys there."  Patient states he feels worthless "That nobody cares about me, that I can't be happy, and that I'm a disappointment."  Patient states he has been having suicidal thoughts of want to be dead but no specific plan, but he is unable to contract for safety.  He denies homicidal ideation, psychosis, and paranoia.  Patient does state that he  watched his ex father in law try to kill himself twice by stabbing himself in the neck.  Denies prior psychiatric hospitalization, psychotropic medications, or outpatient psychiatric services.  Patient is currently living with his mother who is also supportive.  Patient is employed "All I do is work and sleep."     During evaluation Symir Flitter is seated in exam room with mother at his side.  Patient gave permission for mother to sit in on assessment.  Patient is dressed appropriately for weather with no noted distress.  He is alert/oriented x 4, calm, cooperative, attentive, and responses were relevant and appropriate to assessment questions.  He spoke in a clear tone at moderate volume, and normal pace, with good eye contact.   He denies homicidal ideation, psychosis, and paranoia but continues to endorse suicidal ideation with no specific plan but unable to contract for safety.  Objectively there is no evidence of psychosis/mania or delusional thinking.  He conversed coherently, with goal directed thoughts, no distractibility, or pre-occupation.  Recommended for inpatient psychiatric treatment.    Flowsheet Row ED from 12/11/2022 in [facility]  C-SSRS RISK CATEGORY Low Risk       Psychiatric Specialty Exam  Presentation  General Appearance:Appropriate for Environment  Eye Contact:Minimal  Speech:Clear and Coherent; Normal Rate  Speech Volume:Normal  Handedness:Right   Mood and Affect  Mood: Depressed  Affect: Depressed; Flat   Thought Process  Thought Processes:  Coherent; Goal Directed  Descriptions of Associations:Intact  Orientation:Full (Time, Place and Person)  Thought Content:Logical    Hallucinations:None  Ideas of Reference:None  Suicidal Thoughts:Yes, Passive Without Plan (unable to contract for safety)  Homicidal Thoughts:No   Sensorium  Memory: Immediate Good; Recent Good; Remote  Good  Judgment: Intact  Insight: Fair; Present   Executive Functions  Concentration: Good  Attention Span: Good  Recall: Good  Fund of Knowledge: Good  Language: Good   Psychomotor Activity  Psychomotor Activity: Normal   Assets  Assets: Communication Skills; Desire for Improvement; Financial Resources/Insurance; Housing; Leisure Time; Physical Health; Resilience; Social Support; Transportation   Sleep  Sleep: Good  Number of hours: No data recorded  Physical Exam: Physical Exam Vitals and nursing note reviewed. Chaperone present: mother accompanied.  Constitutional:      General: He is not in acute distress.    Appearance: Normal appearance. He is not ill-appearing.  HENT:     Head: Normocephalic.  Eyes:     Conjunctiva/sclera: Conjunctivae normal.  Cardiovascular:     Rate and Rhythm: Normal rate.  Pulmonary:     Effort: Pulmonary effort is normal.  Musculoskeletal:        General: Normal range of motion.     Cervical back: Normal range of motion.  Skin:    General: Skin is warm and dry.  Neurological:     Mental Status: He is alert and oriented to person, place, and time.  Psychiatric:        Attention and Perception: Attention and perception normal. He does not perceive auditory or visual hallucinations.        Mood and Affect: Mood is depressed. Affect is flat.        Speech: Speech normal.        Behavior: Behavior normal. Behavior is cooperative.        Thought Content: Thought content is not paranoid or delusional. Thought content does not include homicidal ideation. Suicidal: Passive thoughts unable to contract for safety.       Cognition and Memory: Cognition and memory normal.        Judgment: Judgment is impulsive.    Review of Systems  Constitutional:        No other complaints voiced  Psychiatric/Behavioral:  Positive for depression. Hallucinations: Denies. Substance abuse: denies. Suicidal ideas: Passive thoughts.  States that  he wouldn't actively kill himself but doesn't care if he dies..The patient is nervous/anxious. The patient does not have insomnia.   All other systems reviewed and are negative.  Blood pressure 110/77, pulse 89, temperature 97.6 F (36.4 C), temperature source Oral, resp. rate 17, SpO2 98%. There is no height or weight on file to calculate BMI.  Musculoskeletal: Strength & Muscle Tone: within normal limits Gait & Station: normal Patient leans: N/A   [facility] MSE Discharge Disposition for Follow up and Recommendations: Based on my evaluation the patient does not appear to have an emergency medical condition and can be discharged with resources and follow up care in outpatient services for Inpatient psychiatric treatment   Marveline Profeta, NP 12/11/2022, 6:37 PM

## 2022-12-11 NOTE — Progress Notes (Addendum)
   12/11/22 1703  [facility] Triage Screening (Walk-ins at [facility] only)  What Is the Reason for Your Visit/Call Today? Pt arrived to [facility] voluntarily accompanied by his mother. Pt states that he is tired of living but he doesn't want to kill himself. Pt states that he would be fine if he was to get in an accident and not make it. Per pt's mother there is a family history of depression. Pt states that he did cause self afflicted pain to himself in the past by cutting. Pt states that he just wants to sit in a dark corner and be alone. Pt denies SI and AVH at this current moment. Pt endorses HI at this time, but states that he doesn't want to kill him but he does want to beat the "shit" out of him. Pt states he used a tobacco vape pen before arriving to the facility. Pt states that he goes at least 2 days without eating but will try on the 3rd day.  How Long Has This Been Causing You Problems? 1 wk - 1 month  Have You Recently Had Any Thoughts About Hurting Yourself? No  Are You Planning to Commit Suicide/Harm Yourself At This time? No  Have you Recently Had Thoughts About Hurting Someone Karolee Ohs? Yes  How long ago did you have thoughts of harming others? a few weeks ago  Are You Planning To Harm Someone At This Time? No  Are you currently experiencing any auditory, visual or other hallucinations? No  Have You Used Any Alcohol or Drugs in the Past 24 Hours? Yes  How long ago did you use Drugs or Alcohol? before arriving  What Did You Use and How Much? vaping a tobacco pen  Do you have any current medical co-morbidities that require immediate attention? No  Clinician description of patient physical appearance/behavior: neatly dressed, poor eye contact, cooperative  What Do You Feel Would Help You the Most Today? Treatment for Depression or other mood problem;Social Support  If access to [facility] Urgent Care was not available, would you have sought care in the Emergency Department? No  Determination of Need Urgent (48  hours)  Options For Referral Intensive Outpatient Therapy;Medication Management

## 2022-12-11 NOTE — Progress Notes (Signed)
Pt meets inpatient BH placement per Shuvon Rankin,NP. Per [facility] Danika Riley,RN pt can be accepted within the [facility] system PENDING discharges TOMORROW 12/12/2022. 1st shift to follow up with [facility].    Care Team notified: Day [facility] Danika Riley,RN, Night [facility] Kim Brooks,RN, Bethany Hendra,LCSW, Shuvon Rankin,NP    Maryjean Ka, MSW, [facility] 12/11/2022 8:57 PM

## 2022-12-12 ENCOUNTER — Encounter (HOSPITAL_COMMUNITY): Payer: Self-pay | Admitting: Emergency Medicine

## 2022-12-12 ENCOUNTER — Inpatient Hospital Stay (HOSPITAL_COMMUNITY)
Admission: AD | Admit: 2022-12-12 | Discharge: 2022-12-17 | DRG: 885 | Disposition: A | Discharge status: Home / Self Care | Payer: BC Managed Care – PPO | Source: Intra-hospital | Attending: Psychiatry | Admitting: Psychiatry

## 2022-12-12 DIAGNOSIS — R480 Dyslexia and alexia: Secondary | ICD-10-CM | POA: Diagnosis present

## 2022-12-12 DIAGNOSIS — Z9152 Personal history of nonsuicidal self-harm: Secondary | ICD-10-CM | POA: Diagnosis not present

## 2022-12-12 DIAGNOSIS — Z7289 Other problems related to lifestyle: Secondary | ICD-10-CM | POA: Diagnosis not present

## 2022-12-12 DIAGNOSIS — Z9151 Personal history of suicidal behavior: Secondary | ICD-10-CM

## 2022-12-12 DIAGNOSIS — F329 Major depressive disorder, single episode, unspecified: Principal | ICD-10-CM | POA: Diagnosis present

## 2022-12-12 DIAGNOSIS — Z630 Problems in relationship with spouse or partner: Secondary | ICD-10-CM | POA: Diagnosis not present

## 2022-12-12 DIAGNOSIS — Z888 Allergy status to other drugs, medicaments and biological substances status: Secondary | ICD-10-CM

## 2022-12-12 DIAGNOSIS — Z818 Family history of other mental and behavioral disorders: Secondary | ICD-10-CM | POA: Diagnosis not present

## 2022-12-12 DIAGNOSIS — R45851 Suicidal ideations: Secondary | ICD-10-CM | POA: Diagnosis not present

## 2022-12-12 DIAGNOSIS — F322 Major depressive disorder, single episode, severe without psychotic features: Secondary | ICD-10-CM | POA: Diagnosis present

## 2022-12-12 DIAGNOSIS — F411 Generalized anxiety disorder: Secondary | ICD-10-CM | POA: Diagnosis present

## 2022-12-12 DIAGNOSIS — F909 Attention-deficit hyperactivity disorder, unspecified type: Secondary | ICD-10-CM | POA: Diagnosis present

## 2022-12-12 DIAGNOSIS — F321 Major depressive disorder, single episode, moderate: Secondary | ICD-10-CM | POA: Diagnosis not present

## 2022-12-12 DIAGNOSIS — Z79899 Other long term (current) drug therapy: Secondary | ICD-10-CM

## 2022-12-12 HISTORY — DX: Dyslexia and alexia: R48.0

## 2022-12-12 MED ORDER — MAGNESIUM HYDROXIDE 400 MG/5ML PO SUSP: 30.0000 mL | Freq: Every day | ORAL | Status: DC | PRN

## 2022-12-12 MED ORDER — HALOPERIDOL 5 MG PO TABS: 5.0000 mg | ORAL_TABLET | Freq: Three times a day (TID) | ORAL | Status: DC | PRN

## 2022-12-12 MED ORDER — ALUM & MAG HYDROXIDE-SIMETH 200-200-20 MG/5ML PO SUSP: 30.0000 mL | ORAL | Status: DC | PRN

## 2022-12-12 MED ORDER — DIPHENHYDRAMINE HCL 25 MG PO CAPS: 50.0000 mg | ORAL_CAPSULE | Freq: Three times a day (TID) | ORAL | Status: DC | PRN

## 2022-12-12 MED ORDER — DIPHENHYDRAMINE HCL 50 MG/ML IJ SOLN: 50.0000 mg | Freq: Three times a day (TID) | INTRAMUSCULAR | Status: DC | PRN

## 2022-12-12 MED ORDER — HALOPERIDOL LACTATE 5 MG/ML IJ SOLN: 5.0000 mg | Freq: Three times a day (TID) | INTRAMUSCULAR | Status: DC | PRN

## 2022-12-12 MED ORDER — HYDROXYZINE HCL 25 MG PO TABS
25.0000 mg | ORAL_TABLET | Freq: Three times a day (TID) | ORAL | Status: DC | PRN
  Administered 2022-12-13 – 2022-12-15 (×3): 25 mg via ORAL
  Filled 2022-12-12 (×4): qty 1

## 2022-12-12 MED ORDER — ACETAMINOPHEN 325 MG PO TABS: 650.0000 mg | ORAL_TABLET | Freq: Four times a day (QID) | ORAL | Status: DC | PRN

## 2022-12-12 MED ORDER — LORAZEPAM 2 MG/ML IJ SOLN: 2.0000 mg | Freq: Three times a day (TID) | INTRAMUSCULAR | Status: DC | PRN

## 2022-12-12 MED ORDER — TRAZODONE HCL 50 MG PO TABS
50.0000 mg | ORAL_TABLET | Freq: Every evening | ORAL | Status: DC | PRN
  Filled 2022-12-12: qty 1

## 2022-12-12 MED ORDER — LORAZEPAM 1 MG PO TABS: 2.0000 mg | ORAL_TABLET | Freq: Three times a day (TID) | ORAL | Status: DC | PRN

## 2022-12-12 NOTE — Progress Notes (Addendum)
Pt has been accepted to [facility] TODAY 12/12/2022, pending voluntary consent. Bed assignment: 406-1  Pt meets inpatient criteria per Jenita Seashore, NP  Attending Physician will be Phineas Inches, MD  Report can be called to: - Adult unit: [phone]  Pt can arrive after 2 PM  Care Team Notified: [facility] Rona Ravens, RN, Harless Litten, RN, Neale Burly, RN, and Olin Pia, NP  Cathie Beams, [place]  12/12/2022 10:35 AM

## 2022-12-12 NOTE — Group Note (Unsigned)
Group Topic: Wellness  Group Date: 12/12/2022 Start Time: 1130 End Time: 1215 Facilitators: Prentice Docker, RN  Department: [facility]  Number of Participants: 5  Group Focus: communication Treatment Modality:  Individual Therapy Interventions utilized were patient education Purpose: increase insight   Name: Itsuki Ripoll Date of Birth: May 06, 1995  MR: [id number]    Level of Participation: {THERAPIES; PSYCH GROUP PARTICIPATION [id number]:19147} Quality of Participation: {THERAPIES; PSYCH QUALITY OF PARTICIPATION:23992} Interactions with others: {THERAPIES; PSYCH INTERACTIONS:23993} Mood/Affect: {THERAPIES; PSYCH MOOD/AFFECT:23994} Triggers (if applicable): *** Cognition: {THERAPIES; PSYCH COGNITION:23995} Progress: {THERAPIES; PSYCH PROGRESS:23997} Response: *** Plan: {THERAPIES; PSYCH WGNF:62130}  Patients Problems:  Patient Active Problem List   Diagnosis Date Noted   MDD (major depressive disorder), single episode, severe , no psychosis (HCC) 12/11/2022   Passive suicidal ideations 12/11/2022

## 2022-12-12 NOTE — ED Notes (Signed)
Pt was given sandwich, chips, and juice for lunch. ?

## 2022-12-12 NOTE — ED Notes (Signed)
Pt sleeping at present, no distress noted.  Monitoring for safety. 

## 2022-12-12 NOTE — ED Notes (Signed)
Pt accepted on Days[facility], report given on Dayshift.  Librarian, academic.

## 2022-12-12 NOTE — ED Notes (Signed)
Update given to [employer], [facility], rm 403-2.  Safe Transport requested.

## 2022-12-12 NOTE — Progress Notes (Signed)
Per nursing pt's bed assignment at [facility] will be updated to 403-2 per Valeda Malm and approval of [facility] [employer].   Pt has been accepted to [facility] TODAY 12/12/2022, pending voluntary consent. Bed assignment: 403-2   Pt meets inpatient criteria per Jenita Seashore, NP   Attending Physician will be Phineas Inches, MD   Report can be called to: - Adult unit: [phone]   Pt can arrive after 2 PM   Care Team Notified: [facility] Rona Ravens, RN, Harless Litten, RN, Neale Burly, RN, and Olin Pia, NP   Maryjean Ka, MSW, [facility] 12/12/2022 5:37 PM

## 2022-12-12 NOTE — ED Provider Notes (Signed)
FBC/OBS ASAP Discharge Summary  Date and Time: 12/12/2022 10:04 AM  Name: Maurice Smith  MRN:  [id number]   Discharge Diagnoses:  Final diagnoses:  MDD (major depressive disorder), single episode, severe , no psychosis (HCC)  Passive suicidal ideations    Subjective: " I thought about going home but also I know I need help...".   Stay Summary:   Maurice Smith is a 27 year-old male admitted to [facility] unit  yesterday, secondary to worsening depressing and passive suicidal ideations. He was evaluated by a provider and reported that he was feeling like he would not care if he died. He reported a hx of self-harm behaviors such cutting , last episode being in 2021.  Patient reported multiple stressors including being divorced for 2 years after finding that his ex was pregnant from another man.  He also recently broke up with his girlfriend.  Patient reported feeling worthless, like no one cares about him. He denied prior psychiatric hospitalizations.   Assessment: patient is evaluated face-to-face  by this NP : 27 year old male  standing in the hallway. He receptive and  cooperative upon approach. He is alert and oriented x 4. He appears flat and depressed but active in this conversation. He does not appear to be preoccupied or responding to internal stimuli. His thought process is clear and goal oriented.  Speech is clear and coherent. His eye contact is good. His thought content is logical and his insight is fair. Admits to still endorsing suicidal thoughts and says that "this time I need some help". He reports that he was thinking about going home but also needs some help for his depression.  He reports that he has been working long hours on a night shift and not eating healthy. Reports that all he does is "pretty much work, eat unhealthy and sleep".  Patient reports no hx of hospitalization. He reports that his current situation has a lot to do with his relationship issues: patient reports that  he lost his marriage a couple of years ago and his new relationship has not been going well. His girlfriend has been cheating on him.  Patient denies homicidal thoughts. He denies hallucinations. Patient reports that this is the first time he seeks help for his emotional instabilities because its becoming more and more serious. He denies substance use. Reports that he drinks alcohol occasionally.   Patient reports no medical/health concerns. He denies hx of surgery. Denies hx of seizures/syncope. Denies headache/dizziness. Denies respiratory problems. Denies neck/back pain. Denies chest/abdominal pain. Denies joint/muscle pain. Reports that he has not been taking care of himself  due to his depression. Admits that he does not eat healthy and his sleeping habits vary due to working a night shift.   Patient reports that he is employed and  lives with  his mother who is supportive. He denies substance abuse and reports that he drinks alcohol occasionally. He reports not having many friends "because I am always working or sleeping".  He continues to express hopelessness along with passive suicidal ideations. Patient is considering voluntary inpatient treatment to address his ongoing depression.  He is accepted at [facility] by Dr Sherron Flemings, MD.        Total Time spent with patient: 30 minutes  Past Psychiatric History: Depression Past Medical History: NA Family History: NA Family Psychiatric History: NA Social History: Divorced. Employed. Lives with mom who is supportive.  Tobacco Cessation:  N/A, patient does not currently use tobacco products  Current Medications:  Current Facility-Administered Medications  Medication Dose Route Frequency Provider Last Rate Last Admin   acetaminophen (TYLENOL) tablet 650 mg  650 mg Oral Q6H PRN Rankin, Shuvon B, NP       alum & mag hydroxide-simeth (MAALOX/MYLANTA) 200-200-20 MG/5ML suspension 30 mL  30 mL Oral Q4H PRN Rankin, Shuvon B, NP       hydrOXYzine (ATARAX)  tablet 25 mg  25 mg Oral TID PRN Rankin, Shuvon B, NP       magnesium hydroxide (MILK OF MAGNESIA) suspension 30 mL  30 mL Oral Daily PRN Rankin, Shuvon B, NP       traZODone (DESYREL) tablet 50 mg  50 mg Oral QHS PRN Rankin, Shuvon B, NP       No current outpatient medications on file.    PTA Medications:  Facility Ordered Medications  Medication   acetaminophen (TYLENOL) tablet 650 mg   alum & mag hydroxide-simeth (MAALOX/MYLANTA) 200-200-20 MG/5ML suspension 30 mL   magnesium hydroxide (MILK OF MAGNESIA) suspension 30 mL   hydrOXYzine (ATARAX) tablet 25 mg   traZODone (DESYREL) tablet 50 mg        No data to display          Flowsheet Row ED from 12/11/2022 in [facility]  C-SSRS RISK CATEGORY High Risk       Musculoskeletal  Strength & Muscle Tone: within normal limits Gait & Station: normal Patient leans: N/A  Psychiatric Specialty Exam  Presentation  General Appearance:  Appropriate for Environment  Eye Contact: Minimal  Speech: Clear and Coherent; Normal Rate  Speech Volume: Normal  Handedness: Right   Mood and Affect  Mood: Depressed  Affect: Depressed; Flat   Thought Process  Thought Processes: Coherent; Goal Directed  Descriptions of Associations:Intact  Orientation:Full (Time, Place and Person)  Thought Content:Logical     Hallucinations:Hallucinations: None  Ideas of Reference:None  Suicidal Thoughts:Suicidal Thoughts: Yes, Passive SI Passive Intent and/or Plan: Without Plan (unable to contract for safety)  Homicidal Thoughts:Homicidal Thoughts: No   Sensorium  Memory: Immediate Good; Recent Good; Remote Good  Judgment: Intact  Insight: Fair; Present   Executive Functions  Concentration: Good  Attention Span: Good  Recall: Good  Fund of Knowledge: Good  Language: Good   Psychomotor Activity  Psychomotor Activity: Psychomotor Activity: Normal   Assets   Assets: Communication Skills; Desire for Improvement; Financial Resources/Insurance; Housing; Leisure Time; Physical Health; Resilience; Social Support; Transportation   Sleep  Sleep: Sleep: Good   Nutritional Assessment (For OBS and FBC admissions only) Has the patient had a weight loss or gain of 10 pounds or more in the last 3 months?: No Has the patient had a decrease in food intake/or appetite?: No Does the patient have dental problems?: No Does the patient have eating habits or behaviors that may be indicators of an eating disorder including binging or inducing vomiting?: No Has the patient recently lost weight without trying?: 0 Has the patient been eating poorly because of a decreased appetite?: 0 Malnutrition Screening Tool Score: 0    Physical Exam  Physical Exam Vitals and nursing note reviewed.  Constitutional:      Appearance: Normal appearance.  HENT:     Head: Normocephalic and atraumatic.     Right Ear: Tympanic membrane normal.     Left Ear: Tympanic membrane normal.     Nose: Nose normal.     Mouth/Throat:     Mouth: Mucous membranes are moist.  Eyes:  Extraocular Movements: Extraocular movements intact.     Pupils: Pupils are equal, round, and reactive to light.  Cardiovascular:     Rate and Rhythm: Normal rate.     Pulses: Normal pulses.  Pulmonary:     Effort: Pulmonary effort is normal.  Abdominal:     General: Abdomen is flat.  Musculoskeletal:        General: Normal range of motion.     Cervical back: Normal range of motion and neck supple.  Skin:    General: Skin is warm.  Neurological:     General: No focal deficit present.     Mental Status: He is alert and oriented to person, place, and time.  Psychiatric:        Behavior: Behavior normal.    Review of Systems  Constitutional: Negative.   HENT: Negative.    Eyes: Negative.   Respiratory: Negative.    Cardiovascular: Negative.   Gastrointestinal: Negative.   Genitourinary:  Negative.   Musculoskeletal: Negative.   Skin: Negative.   Neurological: Negative.   Endo/Heme/Allergies: Negative.   Psychiatric/Behavioral:  Positive for depression and suicidal ideas.    Blood pressure 106/72, pulse 64, temperature 98.4 F (36.9 C), temperature source Oral, resp. rate 18, SpO2 99%. There is no height or weight on file to calculate BMI.  Demographic Factors:  Male, Divorced or widowed, and Caucasian  Loss Factors: Loss of significant relationship  Historical Factors: NA  Risk Reduction Factors:   Employed, Living with another person, especially a relative, and Positive social support  Continued Clinical Symptoms:  Depression:   Anhedonia Hopelessness  Cognitive Features That Contribute To Risk:  None    Suicide Risk:  Moderate:  Frequent suicidal ideation with limited intensity, and duration, some specificity in terms of plans, no associated intent, good self-control, limited dysphoria/symptomatology, some risk factors present, and identifiable protective factors, including available and accessible social support.  Plan Of Care/Follow-up recommendations:  Activity:  per facility protocol Other:  Therapy, medications  Disposition: Admit to [facility] for stabilization  Olin Pia, NP 12/12/2022, 10:04 AM

## 2022-12-13 ENCOUNTER — Other Ambulatory Visit: Payer: Self-pay

## 2022-12-13 ENCOUNTER — Encounter (HOSPITAL_COMMUNITY): Payer: Self-pay

## 2022-12-13 ENCOUNTER — Encounter (HOSPITAL_COMMUNITY): Payer: Self-pay | Admitting: Psychiatry

## 2022-12-13 DIAGNOSIS — F411 Generalized anxiety disorder: Secondary | ICD-10-CM | POA: Diagnosis present

## 2022-12-13 DIAGNOSIS — F321 Major depressive disorder, single episode, moderate: Secondary | ICD-10-CM | POA: Diagnosis not present

## 2022-12-13 LAB — PROLACTIN: Prolactin: 4.7 ng/mL (ref 3.6–31.5)

## 2022-12-13 MED ORDER — NICOTINE POLACRILEX 2 MG MT GUM: 2.0000 mg | CHEWING_GUM | OROMUCOSAL | Status: DC | PRN

## 2022-12-13 MED ORDER — HYDROXYZINE HCL 25 MG PO TABS
25.0000 mg | ORAL_TABLET | Freq: Once | ORAL | Status: AC
  Administered 2022-12-13: 25 mg via ORAL
  Filled 2022-12-13 (×2): qty 1

## 2022-12-13 MED ORDER — FLUOXETINE HCL 10 MG PO CAPS
10.0000 mg | ORAL_CAPSULE | Freq: Every day | ORAL | Status: DC
  Administered 2022-12-13 – 2022-12-15 (×3): 10 mg via ORAL
  Filled 2022-12-13 (×4): qty 1
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
## 2022-12-13 NOTE — B[facility] Counselor (Signed)
Adult Comprehensive Assessment  Patient ID: Maurice Smith, male   DOB: Aug 17, 1995, 27 y.o.   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 Family Relationships: none to report Surveyor, quantity / Lack of resources (include bankruptcy): none to report Housing / Lack of housing: none reported Physical health (include injuries & life threatening diseases): none reported Social relationships: Girlfriend broke up with patient a few days ago Substance abuse: none reported Bereavement / Loss: none reported  Living/Environment/Situation:  Living Arrangements: Parent Living conditions (as described by patient or guardian): Good How long has patient lived in current situation?: 2 years What is atmosphere in current home: Comfortable, Supportive  Family History:  Marital status: Single Does patient have children?: No  Childhood History:  By whom was/is the patient raised?: Mother, Father Description of patient's relationship with caregiver when they were a child: Very good Patient's description of current relationship with people who raised him/her: good How were you disciplined when you got in trouble as a child/adolescent?: time out, spankings, talked to Does patient have siblings?: Yes Number of Siblings: 1 Description of patient's current relationship with siblings: very good Did patient suffer any verbal/emotional/physical/sexual abuse as a child?: No Did patient suffer from severe childhood neglect?: No Has patient ever been sexually abused/assaulted/raped as an adolescent or adult?: No Was the  patient ever a victim of a crime or a disaster?: No Witnessed domestic violence?: No Has patient been affected by domestic violence as an adult?: No  Education:  Highest grade of school patient has completed: 12th Currently a student?: No Learning disability?: No  Employment/Work Situation:   Employment Situation: Employed 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?: No  Financial Resources:   Financial resources: Income from employment, Private insurance Does patient have a representative payee or guardian?: No  Alcohol/Substance Abuse:   What has been your use of drugs/alcohol within the last 12 months?: alcohol at home If attempted suicide, did drugs/alcohol play a role in this?: No Alcohol/Substance Abuse Treatment Hx: Denies past history Has alcohol/substance abuse ever caused legal problems?: No  Social Support System:   Conservation officer, nature Support System: Good Describe Community Support System: Patient states that his mother is very supportive and good to talk to Type of faith/religion: none How does patient's faith help to cope with current illness?: n/a  Leisure/Recreation:   Do You Have Hobbies?: Yes Leisure and Hobbies: Gaming, watching anime and fishing  Strengths/Needs:   What is the patient's perception of their strengths?: He is good at gaming and fishing Patient states they can use these personal strengths during their treatment to contribute to their recovery: yes Patient states these barriers may affect/interfere with their treatment: none reported Patient states  these barriers may affect their return  to the community: none reported Other important information patient would like considered in planning for their treatment: Patient would like to follow up with therapy and medication management  Discharge Plan:   Currently receiving community mental health services: No Patient states they will know when they are safe and ready for discharge when: Once the depression and anxiety levels are lower Does patient have access to transportation?: Yes Does patient have financial barriers related to discharge medications?: No Will patient be returning to same living situation after discharge?: Yes  Summary/Recommendations:   Summary and Recommendations (to be completed by the evaluator): 27 year old male was admitted voluntarily to behavioral health on 12/12/22 for increase in depression and having passive SI with out a plan or intent. Patient states that he is feeling a little better and started medication with no side effects. Patient would like to continue getting medications and to see an outpatient mental health clinic after discharge. Patient will be returning to his home with his mother and has his own transportation to get to appointments. Patient denies si/hi/avh. While here, [name] can benefit from crisis stabilization, medication management, therapeutic milieu, and referrals for services.   Starleen Arms. 12/13/2022

## 2022-12-13 NOTE — BH IP Treatment Plan (Signed)
Interdisciplinary Treatment and Diagnostic Plan Update  12/13/2022 Time of Session: 10:50am Maurice Smith MRN: [id number]  Principal Diagnosis: MDD (major depressive disorder)  Secondary Diagnoses: Principal Problem:   MDD (major depressive disorder)   Current Medications:  Current Facility-Administered Medications  Medication Dose Route Frequency Provider Last Rate Last Admin   acetaminophen (TYLENOL) tablet 650 mg  650 mg Oral Q6H PRN Marlou Sa, NP       alum & mag hydroxide-simeth (MAALOX/MYLANTA) 200-200-20 MG/5ML suspension 30 mL  30 mL Oral Q4H PRN Rayburn Go, Veronique M, NP       diphenhydrAMINE (BENADRYL) capsule 50 mg  50 mg Oral TID PRN Marlou Sa, NP       Or   diphenhydrAMINE (BENADRYL) injection 50 mg  50 mg Intramuscular TID PRN Rayburn Go, Veronique M, NP       FLUoxetine (PROZAC) capsule 10 mg  10 mg Oral Daily Armandina Stammer I, NP   10 mg at 12/13/22 1145   haloperidol (HALDOL) tablet 5 mg  5 mg Oral TID PRN Marlou Sa, NP       Or   haloperidol lactate (HALDOL) injection 5 mg  5 mg Intramuscular TID PRN Marlou Sa, NP       hydrOXYzine (ATARAX) tablet 25 mg  25 mg Oral TID PRN Marlou Sa, NP       LORazepam (ATIVAN) tablet 2 mg  2 mg Oral TID PRN Marlou Sa, NP       Or   LORazepam (ATIVAN) injection 2 mg  2 mg Intramuscular TID PRN Rayburn Go, Veronique M, NP       magnesium hydroxide (MILK OF MAGNESIA) suspension 30 mL  30 mL Oral Daily PRN Rayburn Go, Veronique M, NP       traZODone (DESYREL) tablet 50 mg  50 mg Oral QHS PRN Marlou Sa, NP       PTA Medications: No medications prior to admission.    Patient Stressors: Marital or family conflict    Patient Strengths: General fund of knowledge  Motivation for treatment/growth  Physical Health  Supportive family/friends  Work skills   Treatment Modalities: Medication Management, Group therapy, Case management,  1 to 1 session with  clinician, Psychoeducation, Recreational therapy.   Physician Treatment Plan for Primary Diagnosis: MDD (major depressive disorder) Long Term Goal(s): Improvement in symptoms so as ready for discharge   Short Term Goals: Ability to identify and develop effective coping behaviors will improve Ability to maintain clinical measurements within normal limits will improve Compliance with prescribed medications will improve Ability to identify triggers associated with substance abuse/mental health issues will improve Ability to identify changes in lifestyle to reduce recurrence of condition will improve Ability to verbalize feelings will improve Ability to disclose and discuss suicidal ideas Ability to demonstrate self-control will improve  Medication Management: Evaluate patient's response, side effects, and tolerance of medication regimen.  Therapeutic Interventions: 1 to 1 sessions, Unit Group sessions and Medication administration.  Evaluation of Outcomes: Progressing  Physician Treatment Plan for Secondary Diagnosis: Principal Problem:   MDD (major depressive disorder)  Long Term Goal(s): Improvement in symptoms so as ready for discharge   Short Term Goals: Ability to identify and develop effective coping behaviors will improve Ability to maintain clinical measurements within normal limits will improve Compliance with prescribed medications will improve Ability to identify triggers associated with substance abuse/mental health issues will improve Ability to identify changes in lifestyle to reduce recurrence of condition will improve Ability to verbalize  feelings will improve Ability to disclose and discuss suicidal ideas Ability to demonstrate self-control will improve     Medication Management: Evaluate patient's response, side effects, and tolerance of medication regimen.  Therapeutic Interventions: 1 to 1 sessions, Unit Group sessions and Medication administration.  Evaluation  of Outcomes: Progressing   RN Treatment Plan for Primary Diagnosis: MDD (major depressive disorder) Long Term Goal(s): Knowledge of disease and therapeutic regimen to maintain health will improve  Short Term Goals: Ability to remain free from injury will improve, Ability to verbalize frustration and anger appropriately will improve, Ability to participate in decision making will improve, Ability to verbalize feelings will improve, Ability to disclose and discuss suicidal ideas, Ability to identify and develop effective coping behaviors will improve, and Compliance with prescribed medications will improve  Medication Management: RN will administer medications as ordered by provider, will assess and evaluate patient's response and provide education to patient for prescribed medication. RN will report any adverse and/or side effects to prescribing provider.  Therapeutic Interventions: 1 on 1 counseling sessions, Psychoeducation, Medication administration, Evaluate responses to treatment, Monitor vital signs and CBGs as ordered, Perform/monitor CIWA, COWS, AIMS and Fall Risk screenings as ordered, Perform wound care treatments as ordered.  Evaluation of Outcomes: Progressing   LCSW Treatment Plan for Primary Diagnosis: MDD (major depressive disorder) Long Term Goal(s): Safe transition to appropriate next level of care at discharge, Engage patient in therapeutic group addressing interpersonal concerns.  Short Term Goals: Engage patient in aftercare planning with referrals and resources, Increase social support, Increase emotional regulation, Facilitate acceptance of mental health diagnosis and concerns, Identify triggers associated with mental health/substance abuse issues, and Increase skills for wellness and recovery  Therapeutic Interventions: Assess for all discharge needs, 1 to 1 time with Social worker, Explore available resources and support systems, Assess for adequacy in community support  network, Educate family and significant other(s) on suicide prevention, Complete Psychosocial Assessment, Interpersonal group therapy.  Evaluation of Outcomes: Progressing   Progress in Treatment: Attending groups: No. Participating in groups: No. Taking medication as prescribed: Yes. Toleration medication: Yes. Family/Significant other contact made: No, will contact:  once Pt gives consent Patient understands diagnosis: Yes. Discussing patient identified problems/goals with staff: Yes. Medical problems stabilized or resolved: No. Denies suicidal/homicidal ideation: Yes. Issues/concerns per patient self-inventory: No.  New problem(s) identified: No, Describe:  none reported  New Short Term/Long Term Goal(s):medication stabilization, elimination of SI thoughts, development of comprehensive mental wellness plan.    Patient Goals:  "get better, stop over thinking and stop putting myself down."  Discharge Plan or Barriers: Patient recently admitted. CSW will continue to follow and assess for appropriate referrals and possible discharge planning.    Reason for Continuation of Hospitalization: Depression Medication stabilization Suicidal ideation  Estimated Length of Stay:  5-7 days  Last 3 [place] Suicide Severity Risk Score: Flowsheet Row [facility]Current[facility]2022 [facility]  C-SSRS RISK CATEGORY Low Risk High Risk       Last [facility] 2/9 Scores:    12/12/2022   10:34 AM  Depression screen PHQ 2/9  Decreased Interest 2  Down, Depressed, Hopeless 2  PHQ - 2 Score 4  Altered sleeping 1  Tired, decreased energy 1  Change in appetite 1  Feeling bad or failure about yourself  2  Trouble concentrating 2  Moving slowly or fidgety/restless 1  Suicidal thoughts 1  PHQ-9 Score 13  Difficult  doing work/chores Very difficult    Scribe for Treatment Team: Maurice Smith,  Maurice Smith 12/13/2022 2:08 PM

## 2022-12-13 NOTE — [facility] Suicide Risk Assessment (Signed)
Suicide Risk Assessment  Admission Assessment    [facility] Admission Suicide Risk Assessment   Nursing information obtained from:  Patient  Demographic factors:  Male, Caucasian  Current Mental Status:  NA  Loss Factors:  NA  Historical Factors: Hx of self-mutilating behaviors.   Risk Reduction Factors:  Positive social support, Employed, Sense of responsibility to family, Living with another person, especially a relative  Total Time spent with patient: 1 hour  Principal Problem: MDD (major depressive disorder)  Diagnosis:  Principal Problem:   MDD (major depressive disorder)  Subjective Data: See H&P.  Continued Clinical Symptoms:  Alcohol Use Disorder Identification Test Final Score (AUDIT): 1 The "Alcohol Use Disorders Identification Test", Guidelines for Use in Primary Care, Second Edition.  World Science writer [facility]). Score between 0-7:  no or low risk or alcohol related problems. Score between 8-15:  moderate risk of alcohol related problems. Score between 16-19:  high risk of alcohol related problems. Score 20 or above:  warrants further diagnostic evaluation for alcohol dependence and treatment.  CLINICAL FACTORS:   Severe Anxiety and/or Agitation Depression:   Hopelessness More than one psychiatric diagnosis  Musculoskeletal: Strength & Muscle Tone: within normal limits Gait & Station: normal Patient leans: N/A  Psychiatric Specialty Exam:  Presentation  General Appearance:  Casual (thin-framed.)  Eye Contact: Good  Speech: Clear and Coherent; Normal Rate  Speech Volume: Normal  Handedness: Right   Mood and Affect  Mood: Anxious; Depressed (timid)  Affect: Congruent; Flat   Thought Process  Thought Processes: Goal Directed; Coherent; Linear  Descriptions of Associations:Intact  Orientation:Full (Time, Place and Person)  Thought Content:Logical  History of Schizophrenia/Schizoaffective disorder:No data recorded Duration of  Psychotic Symptoms:No data recorded Hallucinations:Hallucinations: None  Ideas of Reference:None  Suicidal Thoughts:Suicidal Thoughts: No SI Passive Intent and/or Plan: Without Intent; Without Plan; Without Means to Carry Out; Without Access to Means  Homicidal Thoughts:Homicidal Thoughts: No   Sensorium  Memory: Immediate Good; Recent Good; Remote Good  Judgment: Fair  Insight: Fair   Art therapist  Concentration: Good  Attention Span: Good  Recall: Good  Fund of Knowledge: Fair  Language: Good  Psychomotor Activity  Psychomotor Activity: Psychomotor Activity: Normal  Assets  Assets: Communication Skills; Desire for Improvement; Financial Resources/Insurance; Housing; Resilience; Physical Health; Social Support; Vocational/Educational  Sleep  Sleep: Sleep: Good Number of Hours of Sleep: 7.5  Physical Exam: Blood pressure 109/76, pulse 97, temperature 98.2 F (36.8 C), temperature source Oral, resp. rate 18, height 5\' 7"  (1.702 m), weight 56.4 kg, SpO2 100%. Body mass index is 19.48 kg/m.  COGNITIVE FEATURES THAT CONTRIBUTE TO RISK:  Closed-mindedness, Polarized thinking, and Thought constriction (tunnel vision)    SUICIDE RISK:   Moderate:  Frequent suicidal ideation with limited intensity, and duration, some specificity in terms of plans, no associated intent, good self-control, limited dysphoria/symptomatology, some risk factors present, and identifiable protective factors, including available and accessible social support.  PLAN OF CARE: See H&P.  I certify that inpatient services furnished can reasonably be expected to improve the patient's condition.   Armandina Stammer, NP, pmhnp, [id number]. 12/13/2022, 3:57 PM

## 2022-12-13 NOTE — [facility] Group Notes (Signed)
Adult Psychoeducational Group Note  Date:  12/13/2022 Time:  9:56 PM  Group Topic/Focus:  Wrap-Up Group:   The focus of this group is to help patients review their daily goal of treatment and discuss progress on daily workbooks.  Participation Level:  Active  Participation Quality:  Appropriate  Affect:  Appropriate  Cognitive:  Appropriate  Insight: Appropriate  Engagement in Group:  Engaged  Modes of Intervention:  Discussion  Additional Comments:  Pt attended the evening AA group.  [name] 12/13/2022, 9:56 PM
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
## 2022-12-13 NOTE — Tx Team (Signed)
Initial Treatment Plan 12/13/2022 5:55 AM [name] ZOX:[id number]    PATIENT STRESSORS: Marital or family conflict     PATIENT STRENGTHS: General fund of knowledge  Motivation for treatment/growth  Physical Health  Supportive family/friends  Work skills    PATIENT IDENTIFIED PROBLEMS: Increase Depression  Suicidal thoughts                   DISCHARGE CRITERIA:  Improved stabilization in mood, thinking, and/or behavior  PRELIMINARY DISCHARGE PLAN: Return to previous living arrangement Return to previous work or school arrangements  PATIENT/FAMILY INVOLVEMENT: This treatment plan has been presented to and reviewed with the patient, [name]. The patient has been given the opportunity to ask questions and make suggestions.  Marja Kays, RN 12/13/2022, 5:55 AM

## 2022-12-14 DIAGNOSIS — F321 Major depressive disorder, single episode, moderate: Secondary | ICD-10-CM | POA: Diagnosis not present

## 2022-12-14 NOTE — Progress Notes (Signed)
   12/14/22 2052  Psych Admission Type (Psych Patients Only)  Admission Status Voluntary  Psychosocial Assessment  Patient Complaints None  Eye Contact Fair  Facial Expression Animated  Affect Appropriate to circumstance  Speech Logical/coherent  Interaction Assertive  Motor Activity Other (Comment) (WNL)  Appearance/Hygiene Unremarkable  Behavior Characteristics Cooperative  Mood Pleasant  Thought Process  Coherency WDL  Content WDL  Delusions None reported or observed  Perception WDL  Hallucination None reported or observed  Judgment WDL  Confusion None  Danger to Self  Current suicidal ideation? Denies  Self-Injurious Behavior No self-injurious ideation or behavior indicators observed or expressed   Agreement Not to Harm Self Yes  Description of Agreement verbal  Danger to Others  Danger to Others None reported or observed

## 2022-12-14 NOTE — [facility] Group Notes (Signed)
[facility] Group Notes:  (Nursing/MHT/Case Management/Adjunct)  Date:  12/14/2022  Time:  10:46 PM  Type of Therapy:  Psychoeducational Skills  Participation Level:  Active  Participation Quality:  Appropriate  Affect:  Appropriate  Cognitive:  Appropriate  Insight:  Appropriate  Engagement in Group:  Improving  Modes of Intervention:  Support  Summary of Progress/Problems: the patient rated his day as a 9 out of 10 despite not having access to recreation today. He states that he had a good visit with his sister. He accomplished his goal for the day which was to "not over think" about his past. His goal for tomorrow is to continue working on not over thinking about his past.   Westly Pam 12/14/2022, 10:46 PM

## 2022-12-14 NOTE — Plan of Care (Signed)
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
## 2022-12-14 NOTE — Progress Notes (Signed)
   12/14/22 0811  Psych Admission Type (Psych Patients Only)  Admission Status Voluntary  Psychosocial Assessment  Patient Complaints None  Eye Contact Fair  Facial Expression Animated  Affect Appropriate to circumstance  Speech Logical/coherent  Interaction Assertive  Motor Activity Other (Comment) (WDL)  Appearance/Hygiene Unremarkable  Behavior Characteristics Cooperative  Mood Pleasant  Thought Process  Coherency WDL  Content WDL  Delusions None reported or observed  Perception WDL  Hallucination None reported or observed  Judgment WDL  Confusion None  Danger to Self  Current suicidal ideation? Denies  Self-Injurious Behavior No self-injurious ideation or behavior indicators observed or expressed   Agreement Not to Harm Self Yes  Description of Agreement verbal  Danger to Others  Danger to Others None reported or observed

## 2022-12-15 MED ORDER — FLUOXETINE HCL 20 MG PO CAPS
20.0000 mg | ORAL_CAPSULE | Freq: Every day | ORAL | Status: DC
  Administered 2022-12-16 – 2022-12-17 (×2): 20 mg via ORAL
  Filled 2022-12-15 (×4): qty 1

## 2022-12-15 MED ORDER — FLUOXETINE HCL 10 MG PO CAPS
10.0000 mg | ORAL_CAPSULE | Freq: Once | ORAL | Status: AC
  Administered 2022-12-15: 10 mg via ORAL
  Filled 2022-12-15: qty 1

## 2022-12-15 NOTE — [facility] Group Notes (Addendum)
LCSW Group Therapy Note  12/15/2022   10:00-11:00am   Type of Therapy and Topic:  Group Therapy: Anger Cues and Responses  Participation Level:  Active   Description of Group:   In this group, patients learned how to recognize the physical, cognitive, emotional, and behavioral responses they have to anger-provoking situations.  They identified a recent time they became angry and how they reacted.  They analyzed how their reaction was possibly beneficial and how it was possibly unhelpful.  The group discussed a variety of healthier coping skills that could help with such a situation in the future.  They also learned that anger is a second emotion fueled by other feelings and explored their own emotions that may frequently fuel their anger.  Focus was placed on how helpful it is to recognize the underlying emotions to our anger, because working on those can lead to a more permanent solution as well as our ability to focus on the important rather than the urgent.  Therapeutic Goals: Patients will remember their last incident of anger and how they felt emotionally and physically, what their thoughts were at the time, and how they behaved. Patients will identify how their behavior at that time worked for them, as well as how it worked against them. Patients will explore possible new behaviors to use in future anger situations. Patients will learn that anger itself is normal and cannot be eliminated, and that healthier reactions can assist with resolving conflict rather than worsening situations. Patients will learn that anger is a secondary emotion and worked to identify some of the underlying feelings that may lead to anger.  Summary of Patient Progress: Patient was active in group. Patient was present through out group. Patient shared he is a laid back person overall but that lies do trigger his anger. Patient engaged appropriately in discussion with peers.   Therapeutic Modalities:   Cognitive  Behavioral Therapy  Shellia Cleverly

## 2022-12-15 NOTE — Progress Notes (Signed)
   12/15/22 0739  Psych Admission Type (Psych Patients Only)  Admission Status Voluntary  Psychosocial Assessment  Patient Complaints None  Eye Contact Fair  Facial Expression Animated  Affect Appropriate to circumstance  Speech Logical/coherent  Interaction Assertive  Motor Activity Other (Comment) (WDL)  Appearance/Hygiene Unremarkable  Behavior Characteristics Cooperative  Mood Pleasant  Thought Process  Coherency WDL  Content WDL  Delusions None reported or observed  Perception WDL  Hallucination None reported or observed  Judgment WDL  Confusion None  Danger to Self  Current suicidal ideation? Denies  Self-Injurious Behavior No self-injurious ideation or behavior indicators observed or expressed   Agreement Not to Harm Self Yes  Description of Agreement verbal  Danger to Others  Danger to Others None reported or observed

## 2022-12-15 NOTE — [facility] Group Notes (Signed)
[facility] Group Notes:  (Nursing/MHT/Case Management/Adjunct)  Date:  12/15/2022  Time:  10:37 PM  Type of Therapy:   Group Wrap Up  Participation Level:  Active  Participation Quality:  Appropriate  Affect:  Appropriate  Cognitive:  Alert and Appropriate  Insight:  Appropriate, Good, and Improving  Engagement in Group:  Engaged, Improving, and Supportive  Modes of Intervention:  Socialization and Support  Summary of Progress/Problems:Pt attended group   Maurice Smith 12/15/2022, 10:37 PM

## 2022-12-15 NOTE — [facility] Group Notes (Signed)
The focus of this group is to help patients establish daily goals to achieve during treatment and discuss how the patient can incorporate goal setting into their daily lives to aide in recovery.  Pt attended Orientation Goals Group. Pt was given information about schedule, house rules and an opportunity to ask questions. Pt was given education about setting SMART goals.

## 2022-12-15 NOTE — Plan of Care (Signed)

## 2022-12-15 NOTE — Progress Notes (Signed)
[facility] MD Progress Note  12/15/2022 11:19 AM Maurice Smith  MRN:  [id number] Subjective:   Maurice Smith is a 27 yr old male who presented on 8/14 to [facility] with worsening depression and passive SI.  PPHx is significant for MDD and Self Injurious Behavior (Cutting- last 2021), and no history of Suicide Attempts or Self Injurious Behavior.   Case was discussed in the multidisciplinary team. MAR was reviewed and patient was compliant with medications.  He received PRN Hydroxyzine last night.   Psychiatric Team made the following recommendations yesterday: -Continue Prozac 10 mg daily for depression/anxiety.      On interview today patient reports he slept good last night, reports for the first time in a while he had a dream (good one about fishing).  He reports his appetite is doing good and is significantly improved.  He reports no SI, HI, or AVH.  He reports no Paranoia or Ideas of Reference.  He reports no issues with his medications.  Discussed with him that we would increase his Prozac today and he was agreeable to this.  Encouraged him to continue attending groups and work on Producer, television/film/video and refining his coping skills.  He reports no other concerns at present.  Principal Problem: MDD (major depressive disorder) Diagnosis: Principal Problem:   MDD (major depressive disorder) Active Problems:   MDD (major depressive disorder), single episode, severe , no psychosis (HCC)   Generalized anxiety disorder  Total Time spent with patient:  I personally spent 35 minutes on the unit in direct patient care. The direct patient care time included face-to-face time with the patient, reviewing the patient's chart, communicating with other professionals, and coordinating care. Greater than 50% of this time was spent in counseling or coordinating care with the patient regarding goals of hospitalization, psycho-education, and discharge planning needs.   Past Psychiatric History: MDD and Self Injurious Behavior  (Cutting- last 2021), and no history of Suicide Attempts or Self Injurious Behavior.  Past Medical History:  Past Medical History:  Diagnosis Date   ADHD    Dyslexia     Past Surgical History:  Procedure Laterality Date   NO PAST SURGERIES     Family History: History reviewed. No pertinent family history. Family Psychiatric  History:  Mother, Sister- MDD Social History:  Social History   Substance and Sexual Activity  Alcohol Use Yes   Comment: occasionally     Social History   Substance and Sexual Activity  Drug Use Never    Social History   Socioeconomic History   Marital status: Single    Spouse name: Not on file   Number of children: Not on file   Years of education: Not on file   Highest education level: Not on file  Occupational History   Not on file  Tobacco Use   Smoking status: Some Days   Smokeless tobacco: Never  Substance and Sexual Activity   Alcohol use: Yes    Comment: occasionally   Drug use: Never   Sexual activity: Not Currently  Other Topics Concern   Not on file  Social History Narrative   Not on file   Social Determinants of Health   Financial Resource Strain: Not on file  Food Insecurity: No Food Insecurity (12/13/2022)   Hunger Vital Sign    Worried About Running Out of Food in the Last Year: Never true    Ran Out of Food in the Last Year: Never true  Transportation Needs: No Transportation Needs (12/13/2022)  PRAPARE - Administrator, Civil Service (Medical): No    Lack of Transportation (Non-Medical): No  Physical Activity: Not on file  Stress: Not on file  Social Connections: Not on file   Additional Social History:                         Sleep: Good  Appetite:  Good  Current Medications: Current Facility-Administered Medications  Medication Dose Route Frequency Provider Last Rate Last Admin   acetaminophen (TYLENOL) tablet 650 mg  650 mg Oral Q6H PRN Marlou Sa, NP       alum & mag  hydroxide-simeth (MAALOX/MYLANTA) 200-200-20 MG/5ML suspension 30 mL  30 mL Oral Q4H PRN Rayburn Go, Veronique M, NP       diphenhydrAMINE (BENADRYL) capsule 50 mg  50 mg Oral TID PRN Marlou Sa, NP       Or   diphenhydrAMINE (BENADRYL) injection 50 mg  50 mg Intramuscular TID PRN Marlou Sa, NP       [START ON 12/16/2022] FLUoxetine (PROZAC) capsule 20 mg  20 mg Oral Daily Pavlos Yon, Mardelle Matte, MD       haloperidol (HALDOL) tablet 5 mg  5 mg Oral TID PRN Marlou Sa, NP       Or   haloperidol lactate (HALDOL) injection 5 mg  5 mg Intramuscular TID PRN Marlou Sa, NP       hydrOXYzine (ATARAX) tablet 25 mg  25 mg Oral TID PRN Marlou Sa, NP   25 mg at 12/14/22 2125   LORazepam (ATIVAN) tablet 2 mg  2 mg Oral TID PRN Marlou Sa, NP       Or   LORazepam (ATIVAN) injection 2 mg  2 mg Intramuscular TID PRN Rayburn Go, Veronique M, NP       magnesium hydroxide (MILK OF MAGNESIA) suspension 30 mL  30 mL Oral Daily PRN Rayburn Go, Veronique M, NP       traZODone (DESYREL) tablet 50 mg  50 mg Oral QHS PRN Marlou Sa, NP        Lab Results: No results found for this or any previous visit (from the past 48 hour(s)).  Blood Alcohol level:  Lab Results  Component Value Date   ETH <10 12/11/2022    Metabolic Disorder Labs: Lab Results  Component Value Date   HGBA1C 5.0 12/11/2022   MPG 96.8 12/11/2022   Lab Results  Component Value Date   PROLACTIN 4.7 12/11/2022   Lab Results  Component Value Date   CHOL 113 12/11/2022   TRIG 39 12/11/2022   HDL 49 12/11/2022   CHOLHDL 2.3 12/11/2022   VLDL 8 12/11/2022   LDLCALC 56 12/11/2022    Physical Findings: AIMS:  , ,  ,  ,    CIWA:    COWS:     Musculoskeletal: Strength & Muscle Tone: within normal limits Gait & Station: normal Patient leans: N/A  Psychiatric Specialty Exam:  Presentation  General Appearance:  Appropriate for Environment; Casual  Eye  Contact: Good  Speech: Clear and Coherent; Normal Rate  Speech Volume: Normal  Handedness: Right   Mood and Affect  Mood: Dysphoric  Affect: Congruent   Thought Process  Thought Processes: Coherent; Goal Directed  Descriptions of Associations:Intact  Orientation:Full (Time, Place and Person)  Thought Content:Logical; WDL  History of Schizophrenia/Schizoaffective disorder:No data recorded Duration of Psychotic Symptoms:No data recorded Hallucinations:Hallucinations: None  Ideas of Reference:None  Suicidal  Thoughts:Suicidal Thoughts: No SI Passive Intent and/or Plan: -- (n/a)  Homicidal Thoughts:Homicidal Thoughts: No   Sensorium  Memory: Immediate Good; Recent Good  Judgment: Fair  Insight: Fair   Art therapist  Concentration: Good  Attention Span: Good  Recall: Good  Fund of Knowledge: Good  Language: Good   Psychomotor Activity  Psychomotor Activity: Psychomotor Activity: Normal   Assets  Assets: Communication Skills; Desire for Improvement; Social Support; Resilience; Housing   Sleep  Sleep: Sleep: Good Number of Hours of Sleep: 9    Physical Exam: Physical Exam Vitals and nursing note reviewed.  Constitutional:      General: He is not in acute distress.    Appearance: Normal appearance. He is normal weight. He is not ill-appearing or toxic-appearing.  HENT:     Head: Normocephalic and atraumatic.  Pulmonary:     Effort: Pulmonary effort is normal.  Musculoskeletal:        General: Normal range of motion.  Neurological:     General: No focal deficit present.     Mental Status: He is alert.    Review of Systems  Respiratory:  Negative for cough and shortness of breath.   Cardiovascular:  Negative for chest pain.  Gastrointestinal:  Negative for abdominal pain, constipation, diarrhea, nausea and vomiting.  Neurological:  Negative for dizziness, weakness and headaches.  Psychiatric/Behavioral:  Positive  for depression (improved). Negative for hallucinations and suicidal ideas. The patient is not nervous/anxious.    Blood pressure 95/78, pulse 89, temperature 97.6 F (36.4 C), temperature source Oral, resp. rate 18, height 5\' 7"  (1.702 m), weight 56.4 kg, SpO2 100%. Body mass index is 19.48 kg/m.   Treatment Plan Summary: Daily contact with patient to assess and evaluate symptoms and progress in treatment and Medication management  Maurice Smith is a 27 yr old male who presented on 8/14 to [facility] with worsening depression and passive SI.  PPHx is significant for MDD and Self Injurious Behavior (Cutting- last 2021), and no history of Suicide Attempts or Self Injurious Behavior.   Abishai has responded well to starting the Prozac without side effect.  We will increase his Prozac this morning.  We will not make any other changes to his medications at this time.  We will continue to monitor .   MDD, Single Episode, Severe, w/out Psychosis: -Increase Prozac to 20 mg daily for depression/anxiety. -Continue Agitation Protocol: Haldol/Ativan/Benadryl   -Continue PRN's: Tylenol, Maalox, Atarax, Milk of Magnesia, Trazodone    Lauro Franklin, MD 12/15/2022, 11:19 AM

## 2022-12-16 DIAGNOSIS — F322 Major depressive disorder, single episode, severe without psychotic features: Secondary | ICD-10-CM | POA: Diagnosis not present

## 2022-12-16 NOTE — Progress Notes (Signed)
[facility] MD Progress Note  12/16/2022 12:28 PM Andras Farrugia  MRN:  [id number] Subjective:   Maurice Smith is a 27 yr old male who presented on 8/14 to [facility] with worsening depression and passive SI.  PPHx is significant for MDD and Self Injurious Behavior (Cutting- last 2021), and no history of Suicide Attempts or Self Injurious Behavior.   Case was discussed in the multidisciplinary team. MAR was reviewed and patient was compliant with medications.  He received PRN Hydroxyzine last night.   Psychiatric Team made the following recommendations yesterday: -Increase Prozac to 20 mg daily for depression/anxiety.     On interview today patient reports he slept good last night.  He reports his appetite is doing good.  He reports no SI, HI, or AVH.  He reports no Paranoia or Ideas of Reference.  He reports no issues with his medications.  He reports he is doing better.  He reports that he is looking forward to going fishing this weekend and take a little vacation and do something fun for himself.  Discussed with him that we would not make any changes to his medications today.  Discussed that we would plan for discharge tomorrow and he was agreeable with this.  He reports no other concerns at present.    Principal Problem: MDD (major depressive disorder) Diagnosis: Principal Problem:   MDD (major depressive disorder) Active Problems:   MDD (major depressive disorder), single episode, severe , no psychosis (HCC)   Generalized anxiety disorder  Total Time spent with patient:  I personally spent 35 minutes on the unit in direct patient care. The direct patient care time included face-to-face time with the patient, reviewing the patient's chart, communicating with other professionals, and coordinating care. Greater than 50% of this time was spent in counseling or coordinating care with the patient regarding goals of hospitalization, psycho-education, and discharge planning needs.   Past Psychiatric  History: MDD and Self Injurious Behavior (Cutting- last 2021), and no history of Suicide Attempts or Self Injurious Behavior.  Past Medical History:  Past Medical History:  Diagnosis Date   ADHD    Dyslexia     Past Surgical History:  Procedure Laterality Date   NO PAST SURGERIES     Family History: History reviewed. No pertinent family history. Family Psychiatric  History:  Mother, Sister- MDD Social History:  Social History   Substance and Sexual Activity  Alcohol Use Yes   Comment: occasionally     Social History   Substance and Sexual Activity  Drug Use Never    Social History   Socioeconomic History   Marital status: Single    Spouse name: Not on file   Number of children: Not on file   Years of education: Not on file   Highest education level: Not on file  Occupational History   Not on file  Tobacco Use   Smoking status: Some Days   Smokeless tobacco: Never  Substance and Sexual Activity   Alcohol use: Yes    Comment: occasionally   Drug use: Never   Sexual activity: Not Currently  Other Topics Concern   Not on file  Social History Narrative   Not on file   Social Determinants of Health   Financial Resource Strain: Not on file  Food Insecurity: No Food Insecurity (12/13/2022)   Hunger Vital Sign    Worried About Running Out of Food in the Last Year: Never true    Ran Out of Food in the Last Year: Never  true  Transportation Needs: No Transportation Needs (12/13/2022)   PRAPARE - Administrator, Civil Service (Medical): No    Lack of Transportation (Non-Medical): No  Physical Activity: Not on file  Stress: Not on file  Social Connections: Not on file   Additional Social History:                         Sleep: Good  Appetite:  Good  Current Medications: Current Facility-Administered Medications  Medication Dose Route Frequency Provider Last Rate Last Admin   acetaminophen (TYLENOL) tablet 650 mg  650 mg Oral Q6H PRN  Marlou Sa, NP       alum & mag hydroxide-simeth (MAALOX/MYLANTA) 200-200-20 MG/5ML suspension 30 mL  30 mL Oral Q4H PRN Rayburn Go, Veronique M, NP       diphenhydrAMINE (BENADRYL) capsule 50 mg  50 mg Oral TID PRN Marlou Sa, NP       Or   diphenhydrAMINE (BENADRYL) injection 50 mg  50 mg Intramuscular TID PRN Marlou Sa, NP       FLUoxetine (PROZAC) capsule 20 mg  20 mg Oral Daily Lauro Franklin, MD   20 mg at 12/16/22 0750   haloperidol (HALDOL) tablet 5 mg  5 mg Oral TID PRN Marlou Sa, NP       Or   haloperidol lactate (HALDOL) injection 5 mg  5 mg Intramuscular TID PRN Marlou Sa, NP       hydrOXYzine (ATARAX) tablet 25 mg  25 mg Oral TID PRN Marlou Sa, NP   25 mg at 12/15/22 2114   LORazepam (ATIVAN) tablet 2 mg  2 mg Oral TID PRN Marlou Sa, NP       Or   LORazepam (ATIVAN) injection 2 mg  2 mg Intramuscular TID PRN Rayburn Go, Veronique M, NP       magnesium hydroxide (MILK OF MAGNESIA) suspension 30 mL  30 mL Oral Daily PRN Rayburn Go, Veronique M, NP       traZODone (DESYREL) tablet 50 mg  50 mg Oral QHS PRN Marlou Sa, NP        Lab Results: No results found for this or any previous visit (from the past 48 hour(s)).  Blood Alcohol level:  Lab Results  Component Value Date   ETH <10 12/11/2022    Metabolic Disorder Labs: Lab Results  Component Value Date   HGBA1C 5.0 12/11/2022   MPG 96.8 12/11/2022   Lab Results  Component Value Date   PROLACTIN 4.7 12/11/2022   Lab Results  Component Value Date   CHOL 113 12/11/2022   TRIG 39 12/11/2022   HDL 49 12/11/2022   CHOLHDL 2.3 12/11/2022   VLDL 8 12/11/2022   LDLCALC 56 12/11/2022    Physical Findings: AIMS:  , ,  ,  ,    CIWA:    COWS:     Musculoskeletal: Strength & Muscle Tone: within normal limits Gait & Station: normal Patient leans: N/A  Psychiatric Specialty Exam:  Presentation  General Appearance:   Appropriate for Environment; Casual  Eye Contact: Good  Speech: Clear and Coherent; Normal Rate  Speech Volume: Normal  Handedness: Right   Mood and Affect  Mood: -- ("ok")  Affect: Appropriate; Congruent   Thought Process  Thought Processes: Coherent; Goal Directed  Descriptions of Associations:Intact  Orientation:Full (Time, Place and Person)  Thought Content:Logical; WDL  History of Schizophrenia/Schizoaffective disorder:No data recorded Duration of  Psychotic Symptoms:No data recorded Hallucinations:Hallucinations: None  Ideas of Reference:None  Suicidal Thoughts:Suicidal Thoughts: No  Homicidal Thoughts:Homicidal Thoughts: No   Sensorium  Memory: Immediate Good; Recent Good  Judgment: Good  Insight: Good   Executive Functions  Concentration: Good  Attention Span: Good  Recall: Good  Fund of Knowledge: Good  Language: Good   Psychomotor Activity  Psychomotor Activity: Psychomotor Activity: Normal   Assets  Assets: Communication Skills; Desire for Improvement; Social Support; Resilience; Housing   Sleep  Sleep: Sleep: Good Number of Hours of Sleep: 7.75    Physical Exam: Physical Exam Vitals and nursing note reviewed.  Constitutional:      General: He is not in acute distress.    Appearance: Normal appearance. He is normal weight. He is not ill-appearing or toxic-appearing.  HENT:     Head: Normocephalic and atraumatic.  Pulmonary:     Effort: Pulmonary effort is normal.  Musculoskeletal:        General: Normal range of motion.  Neurological:     General: No focal deficit present.     Mental Status: He is alert.    Review of Systems  Respiratory:  Negative for cough and shortness of breath.   Cardiovascular:  Negative for chest pain.  Gastrointestinal:  Negative for abdominal pain, constipation, diarrhea, nausea and vomiting.  Neurological:  Negative for dizziness, weakness and headaches.   Psychiatric/Behavioral:  Negative for depression, hallucinations and suicidal ideas. The patient is not nervous/anxious.    Blood pressure 107/83, pulse 86, temperature 98.3 F (36.8 C), temperature source Oral, resp. rate 18, height 5\' 7"  (1.702 m), weight 56.4 kg, SpO2 100%. Body mass index is 19.48 kg/m.   Treatment Plan Summary: Daily contact with patient to assess and evaluate symptoms and progress in treatment and Medication management  Kordell Franczak is a 27 yr old male who presented on 8/14 to [facility] with worsening depression and passive SI.  PPHx is significant for MDD and Self Injurious Behavior (Cutting- last 2021), and no history of Suicide Attempts or Self Injurious Behavior.   Teyon has tolerated the Prozac increase without issue.  He is doing better and is future oriented as he plans to go fishing this weekend.  As he has responded well we will not make any further changes to his medications.  We will plan for discharge tomorrow.     MDD, Single Episode, Severe, w/out Psychosis: -Continue Prozac to 20 mg daily for depression/anxiety. -Continue Agitation Protocol: Haldol/Ativan/Benadryl   -Continue PRN's: Tylenol, Maalox, Atarax, Milk of Magnesia, Trazodone    Lauro Franklin, MD 12/16/2022, 12:28 PM

## 2022-12-16 NOTE — Group Note (Signed)
Recreation Therapy Group Note   Group Topic:Stress Management  Group Date: 12/16/2022 Start Time: 0930 End Time: 1000 Facilitators: Floy Riegler-McCall, LRT,CTRS Location: [address] Dayroom   Goal Area(s) Addresses:  Patient will actively participate in stress management techniques presented during session.  Patient will successfully identify benefit of practicing stress management post d/c.   Group Description:  Guided Imagery. LRT provided education, instruction, and demonstration on practice of visualization via guided imagery. Patient was asked to participate in the technique introduced during session. LRT debriefed including topics of mindfulness, stress management and specific scenarios each patient could use these techniques. Patients were given suggestions of ways to access scripts post d/c and encouraged to explore Youtube and other apps available on smartphones, tablets, and computers.   Clinical Observations/Individualized Feedback: Unable to conduct group session due to maintenance being completed in dayroom.     Plan: Continue to engage patient in RT group sessions 2-3x/week.   Nasser Ku-McCall, LRT,CTRS 12/16/2022 12:11 PM

## 2022-12-16 NOTE — Plan of Care (Signed)

## 2022-12-16 NOTE — Plan of Care (Signed)
  Problem: Education: Goal: Emotional status will improve Outcome: Progressing   Problem: Activity: Goal: Interest or engagement in activities will improve Outcome: Progressing   Problem: Coping: Goal: Ability to verbalize frustrations and anger appropriately will improve Outcome: Progressing   Problem: Safety: Goal: Periods of time without injury will increase Outcome: Progressing

## 2022-12-16 NOTE — Progress Notes (Signed)
   12/16/22 0800  Psych Admission Type (Psych Patients Only)  Admission Status Voluntary  Psychosocial Assessment  Patient Complaints None  Eye Contact Fair  Facial Expression Animated  Affect Appropriate to circumstance  Speech Logical/coherent  Interaction Assertive  Motor Activity Other (Comment) (WDL)  Appearance/Hygiene Unremarkable  Behavior Characteristics Cooperative;Appropriate to situation  Mood Pleasant  Thought Process  Coherency WDL  Content WDL  Delusions None reported or observed  Perception WDL  Hallucination None reported or observed  Judgment WDL  Confusion None  Danger to Self  Current suicidal ideation? Denies  Agreement Not to Harm Self Yes  Description of Agreement Verbal  Danger to Others  Danger to Others None reported or observed

## 2022-12-16 NOTE — Progress Notes (Signed)
   12/16/22 0600  15 Minute Checks  Location Bedroom  Visual Appearance Calm  Behavior Sleeping  Sleep (Behavioral Health Patients Only)  Calculate sleep? (Click Yes once per 24 hr at 0600 safety check) Yes  Documented sleep last 24 hours 7.75

## 2022-12-16 NOTE — B[facility] Group Notes (Signed)
Adult Psychoeducational Group Note  Date:  12/16/2022 Time:  9:08 PM  Group Topic/Focus:  Wrap-Up Group:   The focus of this group is to help patients review their daily goal of treatment and discuss progress on daily workbooks.  Participation Level:  Active  Participation Quality:  Appropriate  Affect:  Appropriate  Cognitive:  Appropriate  Insight: Appropriate  Engagement in Group:  Engaged  Modes of Intervention:  Discussion  Additional Comments:  Pt told that today was a good day on the unit, the highlight of which was looking forward to his upcoming discharge. "I feel good, like my meds are working for me. I'm ready to go." Pt did mention wanting to learn about his options for having a regular therapist upon discharge and was encouraged to talk about this with his Treatment Team in the morning. Pt rated his day an 8 out of 10.  [name] 12/16/2022, 9:08 PM

## 2022-12-16 NOTE — Group Note (Unsigned)
Date:  12/16/2022 Time:  6:22 PM  Group Topic/Focus:  Goals Group:   The focus of this group is to help patients establish daily goals to achieve during treatment and discuss how the patient can incorporate goal setting into their daily lives to aide in recovery.     Participation Level:  {BHH PARTICIPATION UJWJX:91478}  Participation Quality:  {BHH PARTICIPATION QUALITY:22265}  Affect:  {BHH AFFECT:22266}  Cognitive:  {BHH COGNITIVE:22267}  Insight: {BHH Insight2:20797}  Engagement in Group:  {BHH ENGAGEMENT IN GNFAO:13086}  Modes of Intervention:  {BHH MODES OF INTERVENTION:22269}  Additional Comments:  ***  Maurice Smith 12/16/2022, 6:22 PM

## 2022-12-16 NOTE — Progress Notes (Signed)
   12/15/22 2150  Psych Admission Type (Psych Patients Only)  Admission Status Voluntary  Psychosocial Assessment  Patient Complaints None  Eye Contact Fair  Facial Expression Animated  Affect Appropriate to circumstance  Speech Logical/coherent  Interaction Assertive  Motor Activity Other (Comment) (WDL)  Appearance/Hygiene Unremarkable  Behavior Characteristics Cooperative;Appropriate to situation  Mood Pleasant  Thought Process  Coherency WDL  Content WDL  Delusions None reported or observed  Perception WDL  Hallucination None reported or observed  Judgment WDL  Confusion None  Danger to Self  Current suicidal ideation? Denies  Self-Injurious Behavior No self-injurious ideation or behavior indicators observed or expressed   Agreement Not to Harm Self Yes  Description of Agreement verbal  Danger to Others  Danger to Others None reported or observed

## 2022-12-17 DIAGNOSIS — F322 Major depressive disorder, single episode, severe without psychotic features: Secondary | ICD-10-CM | POA: Diagnosis not present

## 2022-12-17 MED ORDER — HYDROXYZINE HCL 25 MG PO TABS: 25.0000 mg | ORAL_TABLET | Freq: Three times a day (TID) | ORAL | 0 refills | Status: AC | PRN

## 2022-12-17 MED ORDER — FLUOXETINE HCL 20 MG PO CAPS: 20.0000 mg | ORAL_CAPSULE | Freq: Every day | ORAL | 0 refills | Status: AC

## 2022-12-17 NOTE — Progress Notes (Signed)
   12/17/22 0100  Psych Admission Type (Psych Patients Only)  Admission Status Voluntary  Psychosocial Assessment  Patient Complaints None  Eye Contact Fair  Facial Expression Animated  Affect Appropriate to circumstance  Speech Logical/coherent  Interaction Assertive  Motor Activity Other (Comment) (WDL)  Appearance/Hygiene Unremarkable  Behavior Characteristics Cooperative;Appropriate to situation  Mood Pleasant  Thought Process  Coherency WDL  Content WDL  Delusions None reported or observed  Perception WDL  Hallucination None reported or observed  Judgment WDL  Confusion None  Danger to Self  Current suicidal ideation? Denies  Self-Injurious Behavior No self-injurious ideation or behavior indicators observed or expressed   Agreement Not to Harm Self Yes  Description of Agreement verbal  Danger to Others  Danger to Others None reported or observed

## 2022-12-17 NOTE — Plan of Care (Signed)
  Problem: Education: Goal: Knowledge of [place] [facility] [place] [facility] Education information/materials will improve 12/17/2022 1144 by Melvenia Needles, RN Outcome: Completed/Met 12/17/2022 5621 by Melvenia Needles, RN Outcome: Progressing Goal: Emotional status will improve 12/17/2022 1144 by Melvenia Needles, RN Outcome: Completed/Met 12/17/2022 3086 by Melvenia Needles, RN Outcome: Progressing Goal: Mental status will improve 12/17/2022 1144 by Melvenia Needles, RN Outcome: Completed/Met 12/17/2022 5784 by Melvenia Needles, RN Outcome: Progressing Goal: Verbalization of understanding the information provided will improve 12/17/2022 1144 by Melvenia Needles, RN Outcome: Completed/Met 12/17/2022 0823 by Melvenia Needles, RN Outcome: Progressing   Problem: Education: Goal: Emotional status will improve 12/17/2022 1144 by Melvenia Needles, RN Outcome: Completed/Met 12/17/2022 0823 by Melvenia Needles, RN Outcome: Progressing   Problem: Education: Goal: Mental status will improve 12/17/2022 1144 by Melvenia Needles, RN Outcome: Completed/Met 12/17/2022 6962 by Melvenia Needles, RN Outcome: Progressing

## 2022-12-17 NOTE — Progress Notes (Signed)
[name]  D/C'd Home per MD order.  Discussed with the patient and all questions fully answered.  An After Visit Summary was printed and given to the patient. Patient received prescription.  D/c education completed with patient including follow up instructions, medication list, d/c activities limitations if indicated, with other d/c instructions as indicated by MD - patient able to verbalize understanding, all questions fully answered.   Patient instructed to return to ED, call 911, or call MD for any changes in condition.   Patient escorted to main entarnce, and D/C home via private auto.  Melvenia Needles 12/17/2022 11:46 AM

## 2022-12-17 NOTE — Progress Notes (Signed)
   12/17/22 0600  15 Minute Checks  Location Bedroom  Visual Appearance Calm  Behavior Sleeping  Sleep (Behavioral Health Patients Only)  Calculate sleep? (Click Yes once per 24 hr at 0600 safety check) Yes  Documented sleep last 24 hours 8.25

## 2022-12-17 NOTE — [facility] Suicide Risk Assessment (Signed)
[facility] INPATIENT:  Family/Significant Other Suicide Prevention Education  Suicide Prevention Education:  Education Completed; Camila Li [phone]  (name of family member/significant other) has been identified by the patient as the family member/significant other with whom the patient will be residing, and identified as the person(s) who will aid the patient in the event of a mental health crisis (suicidal ideations/suicide attempt).  With written consent from the patient, the family member/significant other has been provided the following suicide prevention education, prior to the and/or following the discharge of the patient. CSW spoke with pt's mother, Tresa Endo who reported that she has removed all firearms from the home. She will also secure knives, sharp objects and medications. She has no safety concerns with pt returning home.  The suicide prevention education provided includes the following: Suicide risk factors Suicide prevention and interventions National Suicide Hotline telephone number [facility] assessment telephone number [facility] Emergency Assistance 911 [facility] and/or Residential Mobile Crisis Unit telephone number  Request made of family/significant other to: Remove weapons (e.g., guns, rifles, knives), all items previously/currently identified as safety concern.   Remove drugs/medications (over-the-counter, prescriptions, illicit drugs), all items previously/currently identified as a safety concern.  The family member/significant other verbalizes understanding of the suicide prevention education information provided.  The family member/significant other agrees to remove the items of safety concern listed above.  Jacquelyn Shadrick R 12/17/2022, 11:10 AM
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
## 2022-12-17 NOTE — Plan of Care (Signed)
  Problem: Education: Goal: Emotional status will improve Outcome: Progressing Goal: Mental status will improve Outcome: Progressing Goal: Verbalization of understanding the information provided will improve Outcome: Progressing   Problem: Activity: Goal: Interest or engagement in activities will improve Outcome: Progressing   Problem: Coping: Goal: Ability to verbalize frustrations and anger appropriately will improve Outcome: Progressing   Problem: Safety: Goal: Periods of time without injury will increase Outcome: Progressing   Problem: Education: Goal: Emotional status will improve Outcome: Progressing Goal: Mental status will improve Outcome: Progressing Goal: Verbalization of understanding the information provided will improve Outcome: Progressing   Problem: Activity: Goal: Interest or engagement in activities will improve Outcome: Progressing   Problem: Coping: Goal: Ability to verbalize frustrations and anger appropriately will improve Outcome: Progressing   Problem: Safety: Goal: Periods of time without injury will increase Outcome: Progressing

## 2022-12-17 NOTE — [facility] Group Notes (Signed)
Spiritual care group on grief and loss facilitated by Chaplain Dyanne Carrel, [facility]  Group Goal: Support / Education around grief and loss  Members engage in facilitated group support and psycho-social education.  Group Description:  Following introductions and group rules, group members engaged in facilitated group dialogue and support around topic of loss, with particular support around experiences of loss in their lives. Group Identified types of loss (relationships / self / things) and identified patterns, circumstances, and changes that precipitate losses. Reflected on thoughts / feelings around loss, normalized grief responses, and recognized variety in grief experience. Group encouraged individual reflection on safe space and on the coping skills that they are already utilizing.  Group drew on Adlerian / Rogerian and narrative framework  Patient Progress: Maurice Smith attended group and actively engaged and participated in group conversation and activities.  He shared about the end of a relationship and how he copes with his grief.  Comments demonstrated good insight in to the topic and he was supportive of other group members.

## 2022-12-17 NOTE — Progress Notes (Signed)
  [facility] Adult Case Management Discharge Plan :  Will you be returning to the same living situation after discharge:  Yes,  pt will be returning home with mother , [name] [phone] At discharge, do you have transportation home?: Yes,  pt will be transported by mother Do you have the ability to pay for your medications: Yes,  pt has active medical coverage  Release of information consent forms completed and in the chart;  Patient's signature needed at discharge.  Patient to Follow up at:  Follow-up Information     [facility] Follow up on 12/25/2022.   Why: You have a hospital follow up appointment on 12/25/22 at 8:30 am, to obtain therapy and medication management services.  Initial appt will be Virtual telehealth. Contact information: [address]  [address] [place] [address] [phone]                 Next level of care provider has access to [facility] Link:no  Safety Planning and Suicide Prevention discussed: Yes,  SPE plan   discussed with mother     Has patient been referred to the Quitline?: Patient does not use tobacco/nicotine products  Patient has been referred for addiction treatment: No known substance use disorder.  Maurice Smith R, LCSW 12/17/2022, 11:30 AM

## 2022-12-17 NOTE — Plan of Care (Signed)

## 2022-12-17 NOTE — [facility] Suicide Risk Assessment (Signed)
Suicide Risk Assessment  Discharge Assessment    [facility] Discharge Suicide Risk Assessment   Principal Problem: MDD (major depressive disorder) Discharge Diagnoses: Principal Problem:   MDD (major depressive disorder) Active Problems:   MDD (major depressive disorder), single episode, severe , no psychosis (HCC)   Generalized anxiety disorder  During the patient's hospitalization, patient had extensive initial psychiatric evaluation, and follow-up psychiatric evaluations every day.  Psychiatric diagnoses provided upon initial assessment: MDD, Single Episode, Severe, w/out psychosis, GAD.  Patient's psychiatric medications were adjusted on admission: Started on Prozac.  During the hospitalization, other adjustments were made to the patient's psychiatric medication regimen: His Prozac was titrated during the hospitalization.  Gradually, patient started adjusting to milieu.   Patient's care was discussed during the interdisciplinary team meeting every day during the hospitalization.  The patient is not having side effects to prescribed psychiatric medication.  The patient reports their target psychiatric symptoms of depression and SI responded well to the psychiatric medications, and the patient reports overall benefit other psychiatric hospitalization. Supportive psychotherapy was provided to the patient. The patient also participated in regular group therapy while admitted.   Labs were reviewed with the patient, and abnormal results were discussed with the patient.  The patient denied having suicidal thoughts more than 48 hours prior to discharge.  Patient denies having homicidal thoughts.  Patient denies having auditory hallucinations.  Patient denies any visual hallucinations.  Patient denies having paranoid thoughts.  The patient is able to verbalize their individual safety plan to this provider.  It is recommended to the patient to continue psychiatric medications as prescribed,  after discharge from the hospital.    It is recommended to the patient to follow up with your outpatient psychiatric provider and PCP.  Discussed with the patient, the impact of alcohol, drugs, tobacco have been there overall psychiatric and medical wellbeing, and total abstinence from substance use was recommended the patient.  Total Time spent with patient: 20 minutes  Musculoskeletal: Strength & Muscle Tone: within normal limits Gait & Station: normal Patient leans: N/A  Psychiatric Specialty Exam  Presentation  General Appearance:  Appropriate for Environment; Casual  Eye Contact: Good  Speech: Clear and Coherent; Normal Rate  Speech Volume: Normal  Handedness: Right   Mood and Affect  Mood: -- ("ok")  Duration of Depression Symptoms: No data recorded Affect: Appropriate; Congruent   Thought Process  Thought Processes: Coherent; Goal Directed  Descriptions of Associations:Intact  Orientation:Full (Time, Place and Person)  Thought Content:Logical; WDL  History of Schizophrenia/Schizoaffective disorder:No data recorded Duration of Psychotic Symptoms:No data recorded Hallucinations:Hallucinations: None  Ideas of Reference:None  Suicidal Thoughts:Suicidal Thoughts: No  Homicidal Thoughts:Homicidal Thoughts: No   Sensorium  Memory: Immediate Good; Recent Good  Judgment: Good  Insight: Good   Executive Functions  Concentration: Good  Attention Span: Good  Recall: Good  Fund of Knowledge: Good  Language: Good   Psychomotor Activity  Psychomotor Activity: Psychomotor Activity: Normal   Assets  Assets: Communication Skills; Desire for Improvement; Physical Health; Resilience; Housing   Sleep  Sleep: Sleep: Good Number of Hours of Sleep: 8.25   Physical Exam: Physical Exam Vitals and nursing note reviewed.  Constitutional:      General: He is not in acute distress.    Appearance: Normal appearance. He is normal  weight. He is not ill-appearing or toxic-appearing.  HENT:     Head: Normocephalic and atraumatic.  Pulmonary:     Effort: Pulmonary effort is normal.  Musculoskeletal:  General: Normal range of motion.  Neurological:     General: No focal deficit present.     Mental Status: He is alert.    Review of Systems  Respiratory:  Negative for cough and shortness of breath.   Cardiovascular:  Negative for chest pain.  Gastrointestinal:  Negative for abdominal pain, constipation, diarrhea, nausea and vomiting.  Neurological:  Negative for dizziness, weakness and headaches.  Psychiatric/Behavioral:  Negative for depression, hallucinations and suicidal ideas. The patient is not nervous/anxious.    Blood pressure 110/85, pulse 83, temperature 98.2 F (36.8 C), temperature source Oral, resp. rate 18, height 5\' 7"  (1.702 m), weight 56.4 kg, SpO2 100%. Body mass index is 19.48 kg/m.  Mental Status Per Nursing Assessment::   On Admission:  NA  Demographic Factors:  Male and Caucasian  Loss Factors: Loss of significant relationship  Historical Factors: Hx of self Injurious Behavior  Risk Reduction Factors:   Sense of responsibility to family, Employed, Living with another person, especially a relative, Positive social support, Positive therapeutic relationship, and Positive coping skills or problem solving skills  Continued Clinical Symptoms:  None  Cognitive Features That Contribute To Risk:  None    Suicide Risk:  Minimal: No identifiable suicidal ideation.  Patients presenting with no risk factors but with morbid ruminations; may be classified as minimal risk based on the severity of the depressive symptoms   Follow-up Information     [facility] up.   Contact information: [address]  [address] [place] [address] [phone]                 Plan Of Care/Follow-up recommendations:  Activity: as tolerated  Diet: heart  healthy  Other: -Follow-up with your outpatient psychiatric provider -instructions on appointment date, time, and address (location) are provided to you in discharge paperwork.  -Take your psychiatric medications as prescribed at discharge - instructions are provided to you in the discharge paperwork  -Follow-up with outpatient primary care doctor and other specialists -for management of chronic medical disease, including: Make all follow up appointments.  -Testing: Follow-up with outpatient provider for abnormal lab results: None  -Recommend abstinence from alcohol, tobacco, and other illicit drug use at discharge.   -If your psychiatric symptoms recur, worsen, or if you have side effects to your psychiatric medications, call your outpatient psychiatric provider, 911, [phone] or go to the nearest emergency department.  -If suicidal thoughts recur, call your outpatient psychiatric provider, 911, [phone] or go to the nearest emergency department.   Lauro Franklin, MD 12/17/2022, 8:49 AM

## 2023-03-11 ENCOUNTER — Emergency Department (HOSPITAL_COMMUNITY)
Admission: EM | Admit: 2023-03-11 | Discharge: 2023-03-11 | Disposition: A | Discharge status: Home / Self Care | Payer: BC Managed Care – PPO | Attending: Emergency Medicine | Admitting: Emergency Medicine

## 2023-03-11 ENCOUNTER — Encounter (HOSPITAL_COMMUNITY): Payer: Self-pay | Admitting: Emergency Medicine

## 2023-03-11 ENCOUNTER — Other Ambulatory Visit: Payer: Self-pay

## 2023-03-11 ENCOUNTER — Emergency Department (HOSPITAL_COMMUNITY): Payer: BC Managed Care – PPO

## 2023-03-11 DIAGNOSIS — Y92410 Unspecified street and highway as the place of occurrence of the external cause: Secondary | ICD-10-CM | POA: Insufficient documentation

## 2023-03-11 DIAGNOSIS — S29019A Strain of muscle and tendon of unspecified wall of thorax, initial encounter: Secondary | ICD-10-CM | POA: Insufficient documentation

## 2023-03-11 DIAGNOSIS — S29012A Strain of muscle and tendon of back wall of thorax, initial encounter: Secondary | ICD-10-CM

## 2023-03-11 DIAGNOSIS — S161XXA Strain of muscle, fascia and tendon at neck level, initial encounter: Secondary | ICD-10-CM | POA: Insufficient documentation

## 2023-03-11 DIAGNOSIS — S199XXA Unspecified injury of neck, initial encounter: Secondary | ICD-10-CM | POA: Diagnosis present

## 2023-03-11 MED ORDER — IBUPROFEN 600 MG PO TABS: 600.0000 mg | ORAL_TABLET | Freq: Three times a day (TID) | ORAL | 0 refills | Status: AC

## 2023-03-11 MED ORDER — METHOCARBAMOL 500 MG PO TABS: 500.0000 mg | ORAL_TABLET | Freq: Two times a day (BID) | ORAL | 0 refills | Status: AC
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
## 2023-03-11 NOTE — ED Notes (Signed)
XRAY resulted Vitals reassessed Pain 1/10 Pt remains in C-Collar, waiting for provider authorize removal
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
## 2023-03-11 NOTE — ED Notes (Signed)
Pt placed in ED room 21 via wheelchair Pt is wearing a c-collar that EMS placed.   Pt complains of back pain that radiates up to his neck
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
